# Patient Record
Sex: Female | Born: 1945 | Race: Black or African American | Hispanic: No | Marital: Married | Smoking: Never smoker
Health system: Southern US, Community
[De-identification: ages and names within clinical notes are randomized; demographics above are authoritative.]

## PROBLEM LIST (undated history)

## (undated) DIAGNOSIS — E785 Hyperlipidemia, unspecified: Secondary | ICD-10-CM

## (undated) DIAGNOSIS — K219 Gastro-esophageal reflux disease without esophagitis: Secondary | ICD-10-CM

## (undated) DIAGNOSIS — I10 Essential (primary) hypertension: Secondary | ICD-10-CM

## (undated) DIAGNOSIS — H43391 Other vitreous opacities, right eye: Secondary | ICD-10-CM

## (undated) DIAGNOSIS — D649 Anemia, unspecified: Secondary | ICD-10-CM

## (undated) DIAGNOSIS — M25571 Pain in right ankle and joints of right foot: Secondary | ICD-10-CM

## (undated) DIAGNOSIS — B9681 Helicobacter pylori [H. pylori] as the cause of diseases classified elsewhere: Secondary | ICD-10-CM

## (undated) DIAGNOSIS — E119 Type 2 diabetes mellitus without complications: Secondary | ICD-10-CM

## (undated) DIAGNOSIS — K635 Polyp of colon: Secondary | ICD-10-CM

## (undated) DIAGNOSIS — K760 Fatty (change of) liver, not elsewhere classified: Secondary | ICD-10-CM

## (undated) DIAGNOSIS — K259 Gastric ulcer, unspecified as acute or chronic, without hemorrhage or perforation: Secondary | ICD-10-CM

## (undated) DIAGNOSIS — Z8719 Personal history of other diseases of the digestive system: Secondary | ICD-10-CM

## (undated) HISTORY — PX: OTHER SURGICAL HISTORY: SHX169

## (undated) HISTORY — PX: ABDOMINAL HYSTERECTOMY: SHX81

## (undated) HISTORY — DX: Other vitreous opacities, right eye: H43.391

## (undated) HISTORY — PX: APPENDECTOMY: SHX54

## (undated) HISTORY — PX: CHOLECYSTECTOMY: SHX55

## (undated) HISTORY — PX: TONSILLECTOMY: SUR1361

## (undated) HISTORY — PX: TUBAL LIGATION: SHX77

---

## 1971-11-28 HISTORY — PX: REDUCTION MAMMAPLASTY: SUR839

## 1981-06-01 HISTORY — PX: ABDOMINAL HYSTERECTOMY: SHX81

## 2003-12-15 ENCOUNTER — Other Ambulatory Visit: Payer: Self-pay

## 2004-08-26 ENCOUNTER — Other Ambulatory Visit: Payer: Self-pay

## 2004-08-30 ENCOUNTER — Ambulatory Visit: Payer: Self-pay | Admitting: General Surgery

## 2005-01-05 ENCOUNTER — Ambulatory Visit: Payer: Self-pay | Admitting: General Surgery

## 2005-08-25 ENCOUNTER — Ambulatory Visit: Payer: Self-pay | Admitting: Family Medicine

## 2005-10-12 ENCOUNTER — Ambulatory Visit: Payer: Self-pay | Admitting: Family Medicine

## 2006-01-04 ENCOUNTER — Ambulatory Visit: Payer: Self-pay | Admitting: Gastroenterology

## 2006-01-09 ENCOUNTER — Ambulatory Visit: Payer: Self-pay | Admitting: Gastroenterology

## 2006-10-15 ENCOUNTER — Ambulatory Visit: Payer: Self-pay | Admitting: Family Medicine

## 2007-12-17 ENCOUNTER — Ambulatory Visit: Payer: Self-pay | Admitting: Family Medicine

## 2008-12-24 ENCOUNTER — Ambulatory Visit: Payer: Self-pay | Admitting: Family Medicine

## 2009-12-27 ENCOUNTER — Ambulatory Visit: Payer: Self-pay | Admitting: Family Medicine

## 2010-03-10 ENCOUNTER — Ambulatory Visit: Payer: Self-pay | Admitting: Family Medicine

## 2010-03-28 ENCOUNTER — Ambulatory Visit: Payer: Self-pay | Admitting: Family Medicine

## 2010-04-11 ENCOUNTER — Ambulatory Visit: Payer: Self-pay | Admitting: Family Medicine

## 2010-08-29 ENCOUNTER — Ambulatory Visit: Payer: Self-pay | Admitting: Family Medicine

## 2011-01-10 ENCOUNTER — Ambulatory Visit: Payer: Self-pay | Admitting: Family Medicine

## 2012-02-08 ENCOUNTER — Ambulatory Visit: Payer: Self-pay | Admitting: Family Medicine

## 2013-02-11 ENCOUNTER — Ambulatory Visit: Payer: Self-pay | Admitting: Family Medicine

## 2014-02-16 ENCOUNTER — Ambulatory Visit: Payer: Self-pay | Admitting: Family Medicine

## 2014-12-08 ENCOUNTER — Emergency Department: Payer: Self-pay | Admitting: Internal Medicine

## 2014-12-08 LAB — CBC
HCT: 41.5 % (ref 35.0–47.0)
HGB: 13.9 g/dL (ref 12.0–16.0)
MCH: 31.1 pg (ref 26.0–34.0)
MCHC: 33.6 g/dL (ref 32.0–36.0)
MCV: 93 fL (ref 80–100)
Platelet: 295 10*3/uL (ref 150–440)
RBC: 4.48 10*6/uL (ref 3.80–5.20)
RDW: 13.4 % (ref 11.5–14.5)
WBC: 10.6 10*3/uL (ref 3.6–11.0)

## 2014-12-08 LAB — COMPREHENSIVE METABOLIC PANEL
ALBUMIN: 4.2 g/dL (ref 3.4–5.0)
ALK PHOS: 113 U/L
ANION GAP: 8 (ref 7–16)
AST: 32 U/L (ref 15–37)
BUN: 15 mg/dL (ref 7–18)
Bilirubin,Total: 0.2 mg/dL (ref 0.2–1.0)
CALCIUM: 9.4 mg/dL (ref 8.5–10.1)
CREATININE: 0.73 mg/dL (ref 0.60–1.30)
Chloride: 101 mmol/L (ref 98–107)
Co2: 29 mmol/L (ref 21–32)
EGFR (African American): >60
Glucose: 165 mg/dL — ABNORMAL HIGH (ref 65–99)
OSMOLALITY: 280 (ref 275–301)
Potassium: 3.3 mmol/L — ABNORMAL LOW (ref 3.5–5.1)
SGPT (ALT): 33 U/L
Sodium: 138 mmol/L (ref 136–145)
Total Protein: 8.3 g/dL — ABNORMAL HIGH (ref 6.4–8.2)

## 2014-12-08 LAB — URINALYSIS, COMPLETE
BILIRUBIN, UR: NEGATIVE
Blood: NEGATIVE
Glucose,UR: 50 mg/dL (ref 0–75)
Ketone: NEGATIVE
Leukocyte Esterase: NEGATIVE
Nitrite: NEGATIVE
PH: 6 (ref 4.5–8.0)
Specific Gravity: 1.011 (ref 1.003–1.030)
Squamous Epithelial: 5

## 2014-12-08 LAB — TROPONIN I: Troponin-I: <0.02 ng/mL

## 2014-12-08 LAB — CK TOTAL AND CKMB (NOT AT ARMC)
CK, Total: 128 U/L (ref 26–192)
CK-MB: 1.2 ng/mL (ref 0.5–3.6)

## 2014-12-08 LAB — TSH: Thyroid Stimulating Horm: 1.84 u[IU]/mL

## 2015-02-19 ENCOUNTER — Ambulatory Visit: Payer: Self-pay | Admitting: Family Medicine

## 2015-05-24 ENCOUNTER — Encounter: Payer: Self-pay | Admitting: *Deleted

## 2015-05-25 ENCOUNTER — Encounter
Admission: RE | Disposition: A | Discharge status: Home / Self Care | Payer: Self-pay | Source: Ambulatory Visit | Attending: Gastroenterology

## 2015-05-25 ENCOUNTER — Ambulatory Visit
Admission: RE | Admit: 2015-05-25 | Discharge: 2015-05-25 | Disposition: A | Discharge status: Home / Self Care | Payer: Medicare PPO | Source: Ambulatory Visit | Attending: Gastroenterology | Admitting: Gastroenterology

## 2015-05-25 ENCOUNTER — Ambulatory Visit: Payer: Medicare PPO | Admitting: Anesthesiology

## 2015-05-25 ENCOUNTER — Encounter: Payer: Self-pay | Admitting: *Deleted

## 2015-05-25 DIAGNOSIS — Z7982 Long term (current) use of aspirin: Secondary | ICD-10-CM | POA: Diagnosis not present

## 2015-05-25 DIAGNOSIS — Z79899 Other long term (current) drug therapy: Secondary | ICD-10-CM | POA: Insufficient documentation

## 2015-05-25 DIAGNOSIS — Z881 Allergy status to other antibiotic agents status: Secondary | ICD-10-CM | POA: Insufficient documentation

## 2015-05-25 DIAGNOSIS — E119 Type 2 diabetes mellitus without complications: Secondary | ICD-10-CM | POA: Diagnosis not present

## 2015-05-25 DIAGNOSIS — I10 Essential (primary) hypertension: Secondary | ICD-10-CM | POA: Diagnosis not present

## 2015-05-25 DIAGNOSIS — D125 Benign neoplasm of sigmoid colon: Secondary | ICD-10-CM | POA: Insufficient documentation

## 2015-05-25 DIAGNOSIS — Z1211 Encounter for screening for malignant neoplasm of colon: Secondary | ICD-10-CM | POA: Diagnosis present

## 2015-05-25 HISTORY — DX: Fatty (change of) liver, not elsewhere classified: K76.0

## 2015-05-25 HISTORY — DX: Essential (primary) hypertension: I10

## 2015-05-25 HISTORY — DX: Gastric ulcer, unspecified as acute or chronic, without hemorrhage or perforation: K25.9

## 2015-05-25 HISTORY — DX: Anemia, unspecified: D64.9

## 2015-05-25 HISTORY — DX: Type 2 diabetes mellitus without complications: E11.9

## 2015-05-25 HISTORY — DX: Helicobacter pylori (H. pylori) as the cause of diseases classified elsewhere: B96.81

## 2015-05-25 HISTORY — PX: COLONOSCOPY: SHX5424

## 2015-05-25 LAB — GLUCOSE, CAPILLARY: Glucose-Capillary: 105 mg/dL — ABNORMAL HIGH (ref 65–99)

## 2015-05-25 SURGERY — COLONOSCOPY
Anesthesia: General

## 2015-05-25 MED ORDER — SODIUM CHLORIDE 0.9 % IV SOLN
INTRAVENOUS | Status: DC
  Administered 2015-05-25: 1000 mL via INTRAVENOUS

## 2015-05-25 MED ORDER — PROPOFOL INFUSION 10 MG/ML OPTIME
INTRAVENOUS | Status: DC | PRN
  Administered 2015-05-25: 140 ug/kg/min via INTRAVENOUS

## 2015-05-25 MED ORDER — FENTANYL CITRATE (PF) 100 MCG/2ML IJ SOLN
25.0000 ug | INTRAMUSCULAR | Status: DC | PRN
  Administered 2015-05-25: 50 ug via INTRAVENOUS

## 2015-05-25 MED ORDER — PHENYLEPHRINE HCL 10 MG/ML IJ SOLN
INTRAMUSCULAR | Status: DC | PRN
  Administered 2015-05-25 (×2): 100 ug via INTRAVENOUS

## 2015-05-25 MED ORDER — ONDANSETRON HCL 4 MG/2ML IJ SOLN: 4.0000 mg | Freq: Once | INTRAMUSCULAR | Status: AC | PRN

## 2015-05-25 MED ORDER — PROPOFOL 10 MG/ML IV BOLUS
INTRAVENOUS | Status: DC | PRN
  Administered 2015-05-25: 50 mg via INTRAVENOUS

## 2015-05-25 MED ORDER — SODIUM CHLORIDE 0.9 % IV SOLN
INTRAVENOUS | Status: DC
  Administered 2015-05-25: 11:00:00 via INTRAVENOUS

## 2015-05-25 NOTE — Transfer of Care (Signed)
Immediate Anesthesia Transfer of Care Note  Patient: Jasmine Welch  Procedure(s) Performed: Procedure(s): COLONOSCOPY (N/A)  Patient Location: PACU  Anesthesia Type:General  Level of Consciousness: sedated  Airway & Oxygen Therapy: Patient Spontanous Breathing and Patient connected to nasal cannula oxygen  Post-op Assessment: Report given to RN and Post -op Vital signs reviewed and stable  Post vital signs: Reviewed and stable  Last Vitals:  Filed Vitals:   05/25/15 0910  BP: 140/85  Pulse: 88  Temp: 36 C  Resp: 20    Complications: No apparent anesthesia complications

## 2015-05-25 NOTE — Anesthesia Preprocedure Evaluation (Signed)
Anesthesia Evaluation  Patient identified by MRN, date of birth, ID band Patient awake    Reviewed: Allergy & Precautions, NPO status , Patient's Chart, lab work & pertinent test results  Airway Mallampati: II       Dental no notable dental hx.    Pulmonary  Sinus allergies breath sounds clear to auscultation  Pulmonary exam normal       Cardiovascular hypertension, Normal cardiovascular exam+ Valvular Problems/Murmurs     Neuro/Psych negative neurological ROS  negative psych ROS   GI/Hepatic Neg liver ROS, PUD,   Endo/Other  diabetes, Well Controlled, Type 2, Oral Hypoglycemic Agents  Renal/GU negative Renal ROS  negative genitourinary   Musculoskeletal negative musculoskeletal ROS (+)   Abdominal Normal abdominal exam  (+)   Peds negative pediatric ROS (+)  Hematology  (+) anemia ,   Anesthesia Other Findings   Reproductive/Obstetrics                             Anesthesia Physical Anesthesia Plan  ASA: II  Anesthesia Plan: General   Post-op Pain Management:    Induction: Intravenous  Airway Management Planned: Nasal Cannula  Additional Equipment:   Intra-op Plan:   Post-operative Plan:   Informed Consent: I have reviewed the patients History and Physical, chart, labs and discussed the procedure including the risks, benefits and alternatives for the proposed anesthesia with the patient or authorized representative who has indicated his/her understanding and acceptance.   Dental advisory given  Plan Discussed with: CRNA and Surgeon  Anesthesia Plan Comments:         Anesthesia Quick Evaluation

## 2015-05-25 NOTE — Anesthesia Postprocedure Evaluation (Signed)
  Anesthesia Post-op Note  Patient: Jasmine Welch  Procedure(s) Performed: Procedure(s): COLONOSCOPY (N/A)  Anesthesia type:General  Patient location: PACU  Post pain: Pain level controlled  Post assessment: Post-op Vital signs reviewed, Patient's Cardiovascular Status Stable, Respiratory Function Stable, Patent Airway and No signs of Nausea or vomiting  Post vital signs: Reviewed and stable  Last Vitals:  Filed Vitals:   05/25/15 1150  BP: 113/75  Pulse: 73  Temp:   Resp: 15    Level of consciousness: awake, alert  and patient cooperative  Complications: No apparent anesthesia complications

## 2015-05-25 NOTE — H&P (Signed)
Outpatient short stay form Pre-procedure 05/25/2015 10:23 AM Lollie Sails MD  Primary Physician: Dr. Juluis Pitch  Reason for visit:  Screening colonoscopy  History of present illness:  She is a 69 year old female setting as above. Patient had a colonoscopy done in 2006. There was no polyps at that time. He has no family history of colon cancer or colon polyps. Does not take any anticoagulates medications and stopped her 81 mg aspirin about a week ago.    Current facility-administered medications:  .  0.9 %  sodium chloride infusion, , Intravenous, Continuous, Lollie Sails, MD .  0.9 %  sodium chloride infusion, , Intravenous, Continuous, Lollie Sails, MD, Last Rate: 50 mL/hr at 05/25/15 0939, 1,000 mL at 05/25/15 0939 .  fentaNYL (SUBLIMAZE) injection 25 mcg, 25 mcg, Intravenous, Q5 min PRN, Alvin Critchley, MD .  ondansetron [facility]) injection 4 mg, 4 mg, Intravenous, Once PRN, Alvin Critchley, MD  Prescriptions prior to admission  Medication Sig Dispense Refill Last Dose  . amLODipine (NORVASC) 10 MG tablet Take 10 mg by mouth daily.     Marland Kitchen aspirin (ASPIRIN EC) 81 MG EC tablet Take 81 mg by mouth daily. Swallow whole.     . cetirizine (ZYRTEC) 10 MG tablet Take 10 mg by mouth daily.     Marland Kitchen dicyclomine (BENTYL) 10 MG capsule Take 10 mg by mouth daily.     Marland Kitchen estrogen-methylTESTOSTERone 0.625-1.25 MG per tablet Take 1 tablet by mouth daily.     . fluticasone (FLONASE) 50 MCG/ACT nasal spray Place 2 sprays into both nostrils daily.     . metFORMIN (GLUCOPHAGE) 500 MG tablet Take by mouth 2 (two) times daily with a meal.     . omeprazole (PRILOSEC) 20 MG capsule Take 20 mg by mouth daily.     . simvastatin (ZOCOR) 10 MG tablet Take 10 mg by mouth daily.     . trandolapril (MAVIK) 4 MG tablet Take 4 mg by mouth daily.     Marland Kitchen triamterene-hydrochlorothiazide (MAXZIDE-25) 37.5-25 MG per tablet Take 1 tablet by mouth daily.        Allergies  Allergen Reactions  . Augmentin  [Amoxicillin-Pot Clavulanate] Other (See Comments)  . Benzodiazepines Other (See Comments)     Past Medical History  Diagnosis Date  . Hypertension   . Anemia   . Diabetes mellitus without complication   . Gastric ulcer due to Helicobacter pylori   . Fatty liver   . Heart murmur     Review of systems:      Physical Exam    Heart and lungs: Regular rate and rhythm without rub or gallop lungs are bilaterally clear    HEENT: Normocephalic atraumatic eyes are anicteric    Other:     Pertinant exam for procedure: Soft nontender nondistended bowel sounds positive normoactive    Planned proceedures: Colonoscopy and indicated procedures    Lollie Sails, MD Gastroenterology 05/25/2015  10:23 AM

## 2015-05-25 NOTE — Op Note (Signed)
[facility] Gastroenterology Patient Name: Jasmine Welch Procedure Date: 05/25/2015 10:25 AM MRN: [id number] Account #: [id number] Date of Birth: 1946/05/12 Admit Type: Outpatient Age: 69 Room: [facility] ENDO ROOM 3 Gender: Female Note Status: Finalized Procedure:         Colonoscopy Indications:       Screening for colorectal malignant neoplasm Providers:         Lollie Sails, MD Referring MD:      Youlanda Roys. Lovie Macadamia, MD (Referring MD) Medicines:         Monitored Anesthesia Care Complications:     No immediate complications. Procedure:         Pre-Anesthesia Assessment:                    - ASA Grade Assessment: II - A patient with mild systemic                     disease.                    After obtaining informed consent, the colonoscope was                     passed under direct vision. Throughout the procedure, the                     patient's blood pressure, pulse, and oxygen saturations                     were monitored continuously. The Olympus PCF-H180AL                     colonoscope ( S#: [id number] ) was introduced through the                     anus and advanced to the the cecum, identified by                     appendiceal orifice and ileocecal valve. The colonoscopy                     was performed without difficulty. The patient tolerated                     the procedure well. The quality of the bowel preparation                     was good. Findings:      Three sessile polyps were found in the mid sigmoid colon. The polyps       were 1 to 2 mm in size. These polyps were removed with a cold biopsy       forceps. Resection and retrieval were complete.      The exam was otherwise normal throughout the examined colon.      The digital rectal exam was normal.      The retroflexed view of the distal rectum and anal verge was normal and       showed no anal or rectal abnormalities. Impression:        - Three 1 to 2 mm polyps in the mid  sigmoid colon.                     Resected and retrieved.                    -  The distal rectum and anal verge are normal on                     retroflexion view. Recommendation:    - Discharge patient to home.                    - Telephone GI clinic for pathology results in 1 week. Procedure Code(s): --- Professional ---                    [phone], Colonoscopy, flexible; with biopsy, single or                     multiple Diagnosis Code(s): --- Professional ---                    V76.51, Special screening for malignant neoplasms of colon                    211.3, Benign neoplasm of colon CPT copyright 2014 American Medical Association. All rights reserved. The codes documented in this report are preliminary and upon coder review may  be revised to meet current compliance requirements. Lollie Sails, MD 05/25/2015 11:07:11 AM This report has been signed electronically. Number of Addenda: 0 Note Initiated On: 05/25/2015 10:25 AM Scope Withdrawal Time: 0 hours 8 minutes 32 seconds  Total Procedure Duration: 0 hours 18 minutes 56 seconds       [facility]

## 2015-05-26 ENCOUNTER — Encounter: Payer: Self-pay | Admitting: Gastroenterology

## 2015-05-26 LAB — SURGICAL PATHOLOGY

## 2015-10-08 ENCOUNTER — Ambulatory Visit: Payer: Medicare PPO | Attending: Orthopedic Surgery | Admitting: Physical Therapy

## 2015-10-08 DIAGNOSIS — M5441 Lumbago with sciatica, right side: Secondary | ICD-10-CM | POA: Insufficient documentation
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
## 2015-10-11 ENCOUNTER — Ambulatory Visit: Payer: Medicare PPO | Admitting: Physical Therapy

## 2015-10-11 DIAGNOSIS — M5441 Lumbago with sciatica, right side: Secondary | ICD-10-CM | POA: Diagnosis not present

## 2015-10-11 NOTE — Therapy (Signed)
[place] [facility] [address], [place], [address] Phone: [phone]   Fax:  [phone]  Physical Therapy Treatment  Patient Details  Name: Jasmine Welch MRN: [id number] Date of Birth: [birth year] Referring Provider: Rachelle Hora  Encounter Date: 10/11/2015      PT End of Session - 10/11/15 0909    Visit Number 2   Number of Visits 13   Date for PT Re-Evaluation 11/19/15   Authorization Type 2   Authorization Time Period 10   PT Start Time 0830   PT Stop Time 0915   PT Time Calculation (min) 45 min   Activity Tolerance Patient tolerated treatment well;No increased pain   Behavior During Therapy [facility] for tasks assessed/performed      Past Medical History  Diagnosis Date  . Hypertension   . Anemia   . Diabetes mellitus without complication   . Gastric ulcer due to Helicobacter pylori   . Fatty liver   . Heart murmur     Past Surgical History  Procedure Laterality Date  . Mamoplastic reduction    . Abdominal hysterectomy    . Tubal ligation    . Tonsillectomy    . Carpel tunnel    . Cholecystectomy    . Colonoscopy N/A 05/25/2015    Procedure: COLONOSCOPY;  Surgeon: Lollie Sails, MD;  Location: [facility] ENDOSCOPY;  Service: Endoscopy;  Laterality: N/A;    There were no vitals filed for this visit.  Visit Diagnosis:  Right-sided low back pain with right-sided sciatica      Subjective Assessment - 10/11/15 0908    Subjective Patient reports her pain is a little better. She performed exercises over the weekend.    Limitations Walking;Standing;Sitting;Lifting;House hold activities   Patient Stated Goals to have decreased pain   Currently in Pain? Yes   Pain Score 3    Pain Location Back   Pain Orientation Right;Posterior;Lower   Pain Descriptors / Indicators Aching;Tender   Pain Type Chronic pain   Pain Onset 1 to 4 weeks ago   Pain Frequency Constant   Multiple Pain Sites No         Treatment:    Supine bridging x 10, lumbar rotations 2x 1 min, SKTC x 5 x 2 reps Manual therapy for muscle trigger point release. Right lumbar paraspinals.  Moist heat at end of session for comfort and pain relief. Educated patient to use tennis ball for self trigger point release/mobilizations.  Patient felt relief with these activities.           PT Education - 10/11/15 0909    Education provided Yes   Education Details exercises, home self mobilization. heat   Person(s) Educated Patient   Methods Explanation;Demonstration;Verbal cues   Comprehension Verbalized understanding;Returned demonstration             PT Long Term Goals - 10/08/15 1124    PT LONG TERM GOAL #1   Title Patient will decrease reported pain to < 2/10 with aggrivating activities.    Baseline 4/10   Time 6   Period Weeks   Status New   PT LONG TERM GOAL #2   Title Patient will demonstrate decreased pain with functional activities by a decreased modified Oswestry score of < 20%   Baseline 28%   Time 6   Period Weeks   Status New               Plan - 10/11/15 [id number]  Clinical Impression Statement Patient has had good response to HEP, requires continued manual techniques for muscle relaxation and decreased pain.    Pt will benefit from skilled therapeutic intervention in order to improve on the following deficits Pain;Difficulty walking;Decreased activity tolerance   Rehab Potential Good   PT Frequency 2x / week   PT Duration 6 weeks   PT Treatment/Interventions Therapeutic activities;Therapeutic exercise;Dry needling;Patient/family education;Neuromuscular re-education;Moist Heat;Manual techniques   PT Next Visit Plan Manual therapy for pain control and for tissue relaxation.    PT Home Exercise Plan lumbar rotation, bridging, SKTC 2 x per day   Consulted and Agree with Plan of Care Patient        Problem List There are no active problems to display for this  patient.   Janeah Kovacich, PT, MPT, GCS 10/11/2015, 9:12 AM  [place] [facility] [address], [place], [address] Phone: [phone]   Fax:  [phone]  Name: Jasmine Welch MRN: [id number] Date of Birth: [birth year]

## 2015-10-14 ENCOUNTER — Encounter: Payer: Medicare PPO | Admitting: Physical Therapy

## 2015-10-15 ENCOUNTER — Ambulatory Visit: Payer: Medicare PPO | Admitting: Physical Therapy

## 2015-10-15 DIAGNOSIS — M5441 Lumbago with sciatica, right side: Secondary | ICD-10-CM | POA: Diagnosis not present

## 2015-10-15 NOTE — Therapy (Signed)
[place] [facility] [address], [place], [address] Phone: [phone]   Fax:  [phone]  Physical Therapy Treatment  Patient Details  Name: Jasmine Welch MRN: [id number] Date of Birth: Jun 05, 1946 Referring Provider: Rachelle Hora  Encounter Date: 10/15/2015      PT End of Session - 10/15/15 0905    Visit Number 3   Number of Visits 13   Date for PT Re-Evaluation 11/19/15   Authorization Type 3   Authorization Time Period 10   PT Start Time 0830   PT Stop Time 0910   PT Time Calculation (min) 40 min   Activity Tolerance Patient tolerated treatment well;Patient limited by pain   Behavior During Therapy [facility] for tasks assessed/performed      Past Medical History  Diagnosis Date  . Hypertension   . Anemia   . Diabetes mellitus without complication   . Gastric ulcer due to Helicobacter pylori   . Fatty liver   . Heart murmur     Past Surgical History  Procedure Laterality Date  . Mamoplastic reduction    . Abdominal hysterectomy    . Tubal ligation    . Tonsillectomy    . Carpel tunnel    . Cholecystectomy    . Colonoscopy N/A 05/25/2015    Procedure: COLONOSCOPY;  Surgeon: Lollie Sails, MD;  Location: [facility] ENDOSCOPY;  Service: Endoscopy;  Laterality: N/A;    There were no vitals filed for this visit.  Visit Diagnosis:  Right-sided low back pain with right-sided sciatica      Subjective Assessment - 10/15/15 0832    Subjective Patient reports pain is better. Has been using heat at home. Feeling very little pain when not moving. But the pain then just grabs her at times.    Limitations Walking;Standing;Sitting;Lifting;House hold activities   Patient Stated Goals to have decreased pain   Currently in Pain? No/denies        Treatment to include,  Moist heat for 5 min for tissue warming and relaxation  Manual therapy to right side lumbar spine for trigger point relaxation and pain  control.  Active assisted Stretching of lumbar spine, piriformis, QL Exercises to include: pelvic tilts, bridging, lumbar rotation, and SKTC x 10 reps each          PT Education - 10/15/15 0905    Education provided Yes   Education Details exercises, breathing, proper form, stretching   Person(s) Educated Patient   Methods Explanation;Demonstration   Comprehension Verbalized understanding;Returned demonstration;Verbal cues required             PT Long Term Goals - 10/08/15 1124    PT LONG TERM GOAL #1   Title Patient will decrease reported pain to < 2/10 with aggrivating activities.    Baseline 4/10   Time 6   Period Weeks   Status New   PT LONG TERM GOAL #2   Title Patient will demonstrate decreased pain with functional activities by a decreased modified Oswestry score of < 20%   Baseline 28%   Time 6   Period Weeks   Status New               Plan - 10/15/15 0920    Clinical Impression Statement Patient is improving with therapy and HEP. Patient continues to have point tenderness at lumbar spine on right side.    Pt will benefit from skilled therapeutic intervention in order to improve on the following deficits  Pain;Difficulty walking;Decreased activity tolerance   Rehab Potential Good   PT Frequency 2x / week   PT Duration 6 weeks   PT Treatment/Interventions Therapeutic activities;Therapeutic exercise;Dry needling;Patient/family education;Neuromuscular re-education;Moist Heat;Manual techniques   PT Next Visit Plan Manual therapy for pain control and for tissue relaxation.    PT Home Exercise Plan lumbar rotation, bridging, SKTC 2 x per day, added pelvic tilts   Consulted and Agree with Plan of Care Patient        Problem List There are no active problems to display for this patient.   Brandee Markin, PT, MPT, GCS 10/15/2015, 9:22 AM  [place] [facility] [address] [place],  [address] Phone: [phone]   Fax:  [phone]  Name: Jasmine Welch MRN: [id number] Date of Birth: 04/26/1946

## 2015-10-18 ENCOUNTER — Ambulatory Visit: Payer: Medicare PPO | Admitting: Physical Therapy

## 2015-10-18 DIAGNOSIS — M5441 Lumbago with sciatica, right side: Secondary | ICD-10-CM

## 2015-10-18 NOTE — Therapy (Signed)
[facility] [address], [place], [address] Phone: [phone]   Fax:  [phone]  Physical Therapy Treatment  Patient Details  Name: Jasmine Welch MRN: [id number] Date of Birth: [birth year] Referring Provider: Rachelle Hora  Encounter Date: 10/18/2015      PT End of Session - 10/18/15 1553    Visit Number 4   Number of Visits 13   Date for PT Re-Evaluation 11/19/15   Authorization Type 4   Authorization Time Period 10   PT Start Time 1500   PT Stop Time 1545   PT Time Calculation (min) 45 min   Activity Tolerance Patient tolerated treatment well;No increased pain   Behavior During Therapy [facility] for tasks assessed/performed      Past Medical History  Diagnosis Date  . Hypertension   . Anemia   . Diabetes mellitus without complication   . Gastric ulcer due to Helicobacter pylori   . Fatty liver   . Heart murmur     Past Surgical History  Procedure Laterality Date  . Mamoplastic reduction    . Abdominal hysterectomy    . Tubal ligation    . Tonsillectomy    . Carpel tunnel    . Cholecystectomy    . Colonoscopy N/A 05/25/2015    Procedure: COLONOSCOPY;  Surgeon: Lollie Sails, MD;  Location: [facility] ENDOSCOPY;  Service: Endoscopy;  Laterality: N/A;    There were no vitals filed for this visit.  Visit Diagnosis:  Right-sided low back pain with right-sided sciatica      Subjective Assessment - 10/18/15 1551    Subjective Patient reports her back pain seems a little better. States she was sore after her last session.    Limitations Walking;Standing;Sitting;Lifting;House hold activities   Patient Stated Goals to have decreased pain   Currently in Pain? No/denies   Multiple Pain Sites No          Treatment to include,  Moist heat for 5 min for tissue warming and relaxation  Manual therapy to right side lumbar spine for trigger point relaxation and pain control.  Active  assisted Stretching of lumbar spine, piriformis, QL Exercises to include: pelvic tilts, bridging, lumbar rotation, and SKTC x 10 reps each bilaterally          PT Education - 10/18/15 1552    Education provided Yes   Education Details exercises to perform at home, breathing through exercises and stretches.    Person(s) Educated Patient   Methods Explanation;Demonstration   Comprehension Verbalized understanding;Returned demonstration             PT Long Term Goals - 10/08/15 1124    PT LONG TERM GOAL #1   Title Patient will decrease reported pain to < 2/10 with aggrivating activities.    Baseline 4/10   Time 6   Period Weeks   Status New   PT LONG TERM GOAL #2   Title Patient will demonstrate decreased pain with functional activities by a decreased modified Oswestry score of < 20%   Baseline 28%   Time 6   Period Weeks   Status New               Plan - 10/18/15 1553    Clinical Impression Statement Patient reports decreased pain with manual techniques and exercises. Will continue along this path of treatment.   Pt will benefit from skilled therapeutic intervention in order to improve on the following deficits Pain;Difficulty walking;Decreased  activity tolerance   Rehab Potential Good   PT Frequency 2x / week   PT Duration 6 weeks   PT Treatment/Interventions Therapeutic activities;Therapeutic exercise;Dry needling;Patient/family education;Neuromuscular re-education;Moist Heat;Manual techniques   PT Next Visit Plan Manual therapy for pain control and for tissue relaxation.    PT Home Exercise Plan lumbar rotation, bridging, SKTC 2 x per day, added pelvic tilts   Consulted and Agree with Plan of Care Patient        Problem List There are no active problems to display for this patient.   Oliviana Mcgahee, PT, MPT, GCS 10/18/2015, 3:58 PM  [place] [facility] [address] [place],  [address] Phone: [phone]   Fax:  [phone]  Name: Jasmine Welch MRN: [id number] Date of Birth: [birth year]

## 2015-10-25 ENCOUNTER — Ambulatory Visit: Payer: Medicare PPO | Admitting: Physical Therapy

## 2015-10-25 DIAGNOSIS — M5441 Lumbago with sciatica, right side: Secondary | ICD-10-CM

## 2015-10-25 NOTE — Therapy (Signed)
[facility] [address] [place], [address] Phone: [phone]   Fax:  [phone]  Physical Therapy Treatment  Patient Details  Name: Jasmine Welch MRN: [id number] Date of Birth: 19-Sep-1946 Referring Provider: Rachelle Hora  Encounter Date: 10/25/2015      PT End of Session - 10/25/15 1519    Visit Number (p) 5   Number of Visits (p) 13   Date for PT Re-Evaluation (p) 11/19/15   Authorization Type (p) 5   Authorization Time Period (p) 10   PT Start Time (p) 0315   PT Stop Time (p) 0400   PT Time Calculation (min) (p) 45 min      Past Medical History  Diagnosis Date  . Hypertension   . Anemia   . Diabetes mellitus without complication   . Gastric ulcer due to Helicobacter pylori   . Fatty liver   . Heart murmur     Past Surgical History  Procedure Laterality Date  . Mamoplastic reduction    . Abdominal hysterectomy    . Tubal ligation    . Tonsillectomy    . Carpel tunnel    . Cholecystectomy    . Colonoscopy N/A 05/25/2015    Procedure: COLONOSCOPY;  Surgeon: Lollie Sails, MD;  Location[facility]COPY;  Service: Endoscopy;  Laterality: N/A;    There were no vitals filed for this visit.  Visit Diagnosis:  Right-sided low back pain with right-sided sciatica      Subjective Assessment - 10/25/15 1517    Subjective Patient reports no pain currently. Feels like her back has improved.    Limitations Walking;Standing;Sitting;Lifting;House hold activities   Patient Stated Goals to have decreased pain   Currently in Pain? No/denies          Treatment to include,  Moist heat for 5 min for tissue warming and relaxation  Manual therapy to right side lumbar spine for trigger point relaxation and pain control.  Exercises to include: pelvic tilts, bridging, lumbar rotation, and SKTC x 10 reps each bilaterally Active standing stretching of right lateral side, extension at  wall, lateral glides at wall. Seated forward and lateral flexion.         PT Education - 10/25/15 1518    Education provided Yes             PT Long Term Goals - 10/08/15 1124    PT LONG TERM GOAL #1   Title Patient will decrease reported pain to < 2/10 with aggrivating activities.    Baseline 4/10   Time 6   Period Weeks   Status New   PT LONG TERM GOAL #2   Title Patient will demonstrate decreased pain with functional activities by a decreased modified Oswestry score of < 20%   Baseline 28%   Time 6   Period Weeks   Status New               Plan - 10/25/15 1604    Clinical Impression Statement Patient reports improved symptoms, however continues to have point tenderness.    Pt will benefit from skilled therapeutic intervention in order to improve on the following deficits Pain;Difficulty walking;Decreased activity tolerance   Rehab Potential Good   PT Frequency 2x / week   PT Duration 6 weeks   PT Treatment/Interventions Therapeutic activities;Therapeutic exercise;Dry needling;Patient/family education;Neuromuscular re-education;Moist Heat;Manual techniques   PT Next Visit Plan Manual therapy for pain control and for tissue relaxation.  PT Home Exercise Plan lumbar rotation, bridging, SKTC 2 x per day, added pelvic tilts   Consulted and Agree with Plan of Care Patient        Problem List There are no active problems to display for this patient.   Mohan Erven, PT, MPT, GCS 10/25/2015, 4:11 PM  [place] [facility] [address] [place], [address] Phone: [phone]   Fax:  [phone]  Name: Jasmine Welch MRN: [id number] Date of Birth: [birth year]

## 2015-10-28 ENCOUNTER — Ambulatory Visit: Payer: Medicare PPO | Attending: Orthopedic Surgery | Admitting: Physical Therapy

## 2015-10-28 DIAGNOSIS — M5441 Lumbago with sciatica, right side: Secondary | ICD-10-CM | POA: Insufficient documentation

## 2015-11-01 ENCOUNTER — Ambulatory Visit: Payer: Medicare PPO | Admitting: Physical Therapy

## 2015-11-01 DIAGNOSIS — M5441 Lumbago with sciatica, right side: Secondary | ICD-10-CM | POA: Diagnosis not present

## 2015-11-01 NOTE — Therapy (Signed)
[facility] [address], [place], [address] Phone: [phone]   Fax:  [phone]  Physical Therapy Treatment  Patient Details  Name: Jasmine Welch MRN: [id number] Date of Birth: 1946-01-24 Referring Provider: Rachelle Hora  Encounter Date: 11/01/2015      PT End of Session - 11/01/15 1602    Visit Number 5   Number of Visits 13   Date for PT Re-Evaluation 11/19/15   Authorization Type 5   Authorization Time Period 10   PT Start Time [id number]   PT Stop Time 1605   PT Time Calculation (min) 50 min   Activity Tolerance Patient tolerated treatment well;No increased pain   Behavior During Therapy [facility] for tasks assessed/performed      Past Medical History  Diagnosis Date  . Hypertension   . Anemia   . Diabetes mellitus without complication   . Gastric ulcer due to Helicobacter pylori   . Fatty liver   . Heart murmur     Past Surgical History  Procedure Laterality Date  . Mamoplastic reduction    . Abdominal hysterectomy    . Tubal ligation    . Tonsillectomy    . Carpel tunnel    . Cholecystectomy    . Colonoscopy N/A 05/25/2015    Procedure: COLONOSCOPY;  Surgeon: Lollie Sails, MD;  Location: [facility] ENDOSCOPY;  Service: Endoscopy;  Laterality: N/A;    There were no vitals filed for this visit.  Visit Diagnosis:  Right-sided low back pain with right-sided sciatica      Subjective Assessment - 11/01/15 1600    Subjective Patient reports she missed appointment last week, thought it was on Friday afternoon. Pt reports some increased back pain since last session.    Limitations Walking;Standing;Sitting;Lifting;House hold activities   Patient Stated Goals to have decreased pain   Currently in Pain? Yes   Pain Score 4    Pain Location Back   Pain Orientation Right;Posterior;Lower   Pain Descriptors / Indicators Aching;Tightness   Pain Type Chronic pain   Pain Onset More than a month  ago   Pain Frequency Intermittent   Multiple Pain Sites No         Treatment to include,  Moist heat for 5 min for tissue warming and relaxation  Manual therapy to right side lumbar spine for trigger point relaxation and pain control.  Exercises to include: pelvic tilts, bridging, lumbar rotation, and SKTC x 10 reps each bilaterally Cues given for proper technique. Active standing extension at wall, lateral glides at wall. Seated forward and lateral flexion x 10-15 reps. Cues given for proper form.            PT Education - 11/01/15 1602    Education provided Yes   Education Details breathing through stretching, HEP for back stretching   Person(s) Educated Patient   Methods Explanation;Demonstration;Verbal cues   Comprehension Verbalized understanding;Returned demonstration             PT Long Term Goals - 10/08/15 1124    PT LONG TERM GOAL #1   Title Patient will decrease reported pain to < 2/10 with aggrivating activities.    Baseline 4/10   Time 6   Period Weeks   Status New   PT LONG TERM GOAL #2   Title Patient will demonstrate decreased pain with functional activities by a decreased modified Oswestry score of < 20%   Baseline 28%   Time 6  Period Weeks   Status New               Plan - 11/01/15 1605    Clinical Impression Statement Patient continues to have right sided back pain with certain movements/positions.    Pt will benefit from skilled therapeutic intervention in order to improve on the following deficits Pain;Difficulty walking;Decreased activity tolerance   Rehab Potential Good   PT Frequency 2x / week   PT Duration 6 weeks   PT Treatment/Interventions Therapeutic activities;Therapeutic exercise;Dry needling;Patient/family education;Neuromuscular re-education;Moist Heat;Manual techniques   PT Next Visit Plan Manual therapy for pain control and for tissue relaxation.    PT Home Exercise Plan lumbar rotation, bridging, SKTC 2 x  per day, added pelvic tilts   Consulted and Agree with Plan of Care Patient        Problem List There are no active problems to display for this patient.   Iverson Sees, PT, MPT, GCS 11/01/2015, 4:06 PM  [place] [facility] [address] [place], [address] Phone: [phone]   Fax:  [phone]  Name: Jasmine Welch MRN: [id number] Date of Birth: April 28, 1946

## 2015-11-04 ENCOUNTER — Ambulatory Visit: Payer: Medicare PPO | Admitting: Physical Therapy

## 2015-11-04 DIAGNOSIS — M5441 Lumbago with sciatica, right side: Secondary | ICD-10-CM

## 2015-11-04 NOTE — Therapy (Signed)
[place] [address], [place], [address] Phone: [phone]   Fax:  [phone]  Physical Therapy Treatment  Patient Details  Name: Jasmine Welch MRN: [id number] Date of Birth: Oct 05, 1946 Referring Provider: Rachelle Hora  Encounter Date: 11/04/2015      PT End of Session - 11/04/15 1555    Visit Number 7   Number of Visits 13   Date for PT Re-Evaluation 11/19/15   Authorization Type 6   Authorization Time Period 10   PT Start Time 1515   PT Stop Time 1600   PT Time Calculation (min) 45 min   Activity Tolerance Patient tolerated treatment well;No increased pain   Behavior During Therapy [facility] for tasks assessed/performed      Past Medical History  Diagnosis Date  . Hypertension   . Anemia   . Diabetes mellitus without complication   . Gastric ulcer due to Helicobacter pylori   . Fatty liver   . Heart murmur     Past Surgical History  Procedure Laterality Date  . Mamoplastic reduction    . Abdominal hysterectomy    . Tubal ligation    . Tonsillectomy    . Carpel tunnel    . Cholecystectomy    . Colonoscopy N/A 05/25/2015    Procedure: COLONOSCOPY;  Surgeon: Lollie Sails, MD;  Location: [facility] ENDOSCOPY;  Service: Endoscopy;  Laterality: N/A;    There were no vitals filed for this visit.  Visit Diagnosis:  Right-sided low back pain with right-sided sciatica      Subjective Assessment - 11/04/15 1552    Subjective Patient reports some improvement in pain, however reports difficulty reaching up.     Limitations Walking;Standing;Sitting;Lifting;House hold activities   Patient Stated Goals to have decreased pain   Currently in Pain? Yes   Pain Score 2    Pain Location Back   Pain Orientation Right;Posterior;Lower   Pain Descriptors / Indicators Aching;Sore;Tightness   Pain Type Chronic pain   Pain Onset More than a month ago   Pain Frequency Intermittent   Multiple Pain  Sites No        Treatment to include,   Manual therapy to right side lumbar spine for trigger point relaxation and pain control.  Exercises to include: pelvic tilts, bridging, lumbar rotation, and SKTC x 20 reps each bilaterally Cues given for proper technique. Active standing extension at wall, at wall. Seated forward and lateral flexion x 10-15 reps. Cues given for proper form.  extension at wall agrivates pain as does pelvic tilt.        PT Education - 11/04/15 1554    Education provided Yes   Education Details stretches, holding stretch at painful point   Person(s) Educated Patient   Methods Explanation   Comprehension Verbalized understanding;Returned demonstration             PT Long Term Goals - 10/08/15 1124    PT LONG TERM GOAL #1   Title Patient will decrease reported pain to < 2/10 with aggrivating activities.    Baseline 4/10   Time 6   Period Weeks   Status New   PT LONG TERM GOAL #2   Title Patient will demonstrate decreased pain with functional activities by a decreased modified Oswestry score of < 20%   Baseline 28%   Time 6   Period Weeks   Status New  Plan - 11/04/15 1556    Clinical Impression Statement Patient continues to have point tnederness at lumbar spine on right with palpation and stretching, however pain level has decreased overall.    Pt will benefit from skilled therapeutic intervention in order to improve on the following deficits Pain;Difficulty walking;Decreased activity tolerance   Rehab Potential Good   PT Frequency 2x / week   PT Duration 6 weeks   PT Treatment/Interventions Therapeutic activities;Therapeutic exercise;Dry needling;Patient/family education;Neuromuscular re-education;Moist Heat;Manual techniques   PT Next Visit Plan Manual therapy for pain control and for tissue relaxation.    PT Home Exercise Plan lumbar rotation, bridging, SKTC 2 x per day, added pelvic tilts   Consulted and Agree with  Plan of Care Patient        Problem List There are no active problems to display for this patient.   Rian Busche, PT, MPT, GCS  11/04/2015, 3:59 PM  [place] [facility] [address], [place], [address] Phone: [phone]   Fax:  [phone]  Name: Jasmine Welch MRN: [id number] Date of Birth: 1946/05/10

## 2015-11-08 ENCOUNTER — Ambulatory Visit: Payer: Medicare PPO | Admitting: Physical Therapy

## 2015-11-08 DIAGNOSIS — M5441 Lumbago with sciatica, right side: Secondary | ICD-10-CM

## 2015-11-08 NOTE — Therapy (Signed)
[place] [facility] [address] [place], [address] Phone: [phone]   Fax:  [phone]  Physical Therapy Treatment  Patient Details  Name: PROVIDENCE HAGGERTY MRN: [id number] Date of Birth: 03/14/1946 Referring Provider: Rachelle Hora  Encounter Date: 11/08/2015      PT End of Session - 11/08/15 1547    Visit Number 8   Number of Visits 13   Date for PT Re-Evaluation 11/19/15   Authorization Type 7   Authorization Time Period 10   PT Start Time 1515   PT Stop Time 1600   PT Time Calculation (min) 45 min   Activity Tolerance Patient tolerated treatment well;No increased pain   Behavior During Therapy [facility] for tasks assessed/performed      Past Medical History  Diagnosis Date  . Hypertension   . Anemia   . Diabetes mellitus without complication   . Gastric ulcer due to Helicobacter pylori   . Fatty liver   . Heart murmur     Past Surgical History  Procedure Laterality Date  . Mamoplastic reduction    . Abdominal hysterectomy    . Tubal ligation    . Tonsillectomy    . Carpel tunnel    . Cholecystectomy    . Colonoscopy N/A 05/25/2015    Procedure: COLONOSCOPY;  Surgeon: Lollie Sails, MD;  Location: [facility] ENDOSCOPY;  Service: Endoscopy;  Laterality: N/A;    There were no vitals filed for this visit.  Visit Diagnosis:  Right-sided low back pain with right-sided sciatica      Subjective Assessment - 11/08/15 1545    Subjective Patient reports she is sore, but that she has been doing more the last few days.    Limitations Walking;Standing;Sitting;Lifting;House hold activities   Patient Stated Goals to have decreased pain   Currently in Pain? Yes   Pain Location Back   Pain Orientation Right;Posterior   Pain Descriptors / Indicators Sore   Pain Type Chronic pain   Pain Onset More than a month ago   Pain Frequency Intermittent   Multiple Pain Sites No         Treatment to include,    Manual therapy to right side lumbar spine for trigger point relaxation and pain control.  Exercises to include:  bridging, lumbar rotation, and SKTC x 20 reps each bilaterally Cues given for proper technique.  Seated forward and lateral flexion x 10-15 reps. standing twisting to each side x 15 reps. Cues given for proper form.             PT Education - 11/08/15 1546    Education provided Yes   Education Details stretching, avoiding activities and stretches that aggrivate her pain.    Person(s) Educated Patient   Methods Explanation   Comprehension Verbalized understanding             PT Long Term Goals - 10/08/15 1124    PT LONG TERM GOAL #1   Title Patient will decrease reported pain to < 2/10 with aggrivating activities.    Baseline 4/10   Time 6   Period Weeks   Status New   PT LONG TERM GOAL #2   Title Patient will demonstrate decreased pain with functional activities by a decreased modified Oswestry score of < 20%   Baseline 28%   Time 6   Period Weeks   Status New               Plan -  11/08/15 1547    Clinical Impression Statement Patient continues to have right sided back soreness. Patient is independent with exercises/stretches to manage pain. Requires continued MT for improvement in symptoms.    Pt will benefit from skilled therapeutic intervention in order to improve on the following deficits Pain;Difficulty walking;Decreased activity tolerance   Rehab Potential Good   PT Frequency 2x / week   PT Duration 6 weeks   PT Treatment/Interventions Therapeutic activities;Therapeutic exercise;Dry needling;Patient/family education;Neuromuscular re-education;Moist Heat;Manual techniques   PT Next Visit Plan Manual therapy for pain control and for tissue relaxation.    PT Home Exercise Plan lumbar rotation, bridging, SKTC 2 x per day, added pelvic tilts ( holding pelvic tilts for now as these aggrivate her pain)   Consulted and Agree with Plan of Care  Patient        Problem List There are no active problems to display for this patient.   Miamor Ayler, PT, MPT, GCS 11/08/2015, 3:51 PM  [place] [facility] [address], [place], [address] Phone: [phone]   Fax:  [phone]  Name: ARLENE SLUTSKY MRN: [id number] Date of Birth: 01-06-1946

## 2015-11-11 ENCOUNTER — Ambulatory Visit: Payer: Medicare PPO | Admitting: Physical Therapy

## 2015-11-11 DIAGNOSIS — M5441 Lumbago with sciatica, right side: Secondary | ICD-10-CM | POA: Diagnosis not present

## 2015-11-11 NOTE — Therapy (Signed)
[place] [facility] [address], [place], [address] Phone: [phone]   Fax:  [phone]  Physical Therapy Treatment  Patient Details  Name: Jasmine Welch MRN: [id number] Date of Birth: [birth year] Referring Provider: Rachelle Hora  Encounter Date: 11/11/2015      PT End of Session - 11/11/15 1623    Visit Number 9   Number of Visits 13   Date for PT Re-Evaluation 11/19/15   Authorization Type 8   Authorization Time Period 10   PT Start Time 1515   PT Stop Time 1600   PT Time Calculation (min) 45 min   Activity Tolerance Patient tolerated treatment well;No increased pain   Behavior During Therapy [facility] for tasks assessed/performed      Past Medical History  Diagnosis Date  . Hypertension   . Anemia   . Diabetes mellitus without complication   . Gastric ulcer due to Helicobacter pylori   . Fatty liver   . Heart murmur     Past Surgical History  Procedure Laterality Date  . Mamoplastic reduction    . Abdominal hysterectomy    . Tubal ligation    . Tonsillectomy    . Carpel tunnel    . Cholecystectomy    . Colonoscopy N/A 05/25/2015    Procedure: COLONOSCOPY;  Surgeon: Lollie Sails, MD;  Location: [facility] ENDOSCOPY;  Service: Endoscopy;  Laterality: N/A;    There were no vitals filed for this visit.  Visit Diagnosis:  Right-sided low back pain with right-sided sciatica      Subjective Assessment - 11/11/15 1622    Subjective Patient reports that she only feels back pain occasionally.    Limitations Walking;Standing;Sitting;Lifting;House hold activities   Patient Stated Goals to have decreased pain   Pain Location Back   Pain Orientation Right;Posterior   Multiple Pain Sites No         Treatment to include,   Manual therapy to right side lumbar spine for trigger point relaxation and pain control.  Exercises to include: bridging, lumbar rotation, and SKTC x 20 reps each  bilaterally Cues given for proper technique. Seated forward and lateral flexion x 10-15 reps. standing twisting to each side x 15 reps. Cues given for proper form.             PT Education - 11/11/15 1622    Education provided Yes   Education Details stretching, exercises   Person(s) Educated Patient   Methods Explanation;Demonstration   Comprehension Verbalized understanding;Returned demonstration             PT Long Term Goals - 10/08/15 1124    PT LONG TERM GOAL #1   Title Patient will decrease reported pain to < 2/10 with aggrivating activities.    Baseline 4/10   Time 6   Period Weeks   Status New   PT LONG TERM GOAL #2   Title Patient will demonstrate decreased pain with functional activities by a decreased modified Oswestry score of < 20%   Baseline 28%   Time 6   Period Weeks   Status New               Plan - 11/11/15 1623    Clinical Impression Statement Patient has decreased back pain reported, continues to have tenderness on palpation, but decreased limitations with daily activities.    Pt will benefit from skilled therapeutic intervention in order to improve on the following deficits Pain;Difficulty walking;Decreased activity tolerance  Rehab Potential Good   PT Frequency 2x / week   PT Duration 6 weeks   PT Treatment/Interventions Therapeutic activities;Therapeutic exercise;Dry needling;Patient/family education;Neuromuscular re-education;Moist Heat;Manual techniques   PT Next Visit Plan Manual therapy for pain control and for tissue relaxation.    PT Home Exercise Plan lumbar rotation, bridging, SKTC 2 x per day, added pelvic tilts ( holding pelvic tilts for now as these aggrivate her pain)   Consulted and Agree with Plan of Care Patient        Problem List There are no active problems to display for this patient.   Nester Bachus, PT, MPT, GCS 11/11/2015, 4:25 PM  [place] [facility] [address] [place], [address] Phone: [phone]   Fax:  [phone]  Name: Jasmine Welch MRN: [id number] Date of Birth: [birth year]

## 2015-11-15 ENCOUNTER — Ambulatory Visit: Payer: Medicare PPO | Admitting: Physical Therapy

## 2015-11-15 DIAGNOSIS — M5441 Lumbago with sciatica, right side: Secondary | ICD-10-CM | POA: Diagnosis not present

## 2015-11-15 NOTE — Therapy (Signed)
[facility] [address], [place], [address] Phone: [phone]   Fax:  [phone]  Physical Therapy Treatment  Patient Details  Name: Jasmine Welch MRN: [id number] Date of Birth: May 18, 1946 Referring Provider: Rachelle Hora  Encounter Date: 11/15/2015      PT End of Session - 11/15/15 1422    Visit Number 10   Number of Visits 13   Date for PT Re-Evaluation 11/19/15   Authorization Type 9   Authorization Time Period 10   PT Start Time [id number]   PT Stop Time 1430   PT Time Calculation (min) 45 min   Activity Tolerance Patient tolerated treatment well;No increased pain   Behavior During Therapy [facility] for tasks assessed/performed      Past Medical History  Diagnosis Date  . Hypertension   . Anemia   . Diabetes mellitus without complication   . Gastric ulcer due to Helicobacter pylori   . Fatty liver   . Heart murmur     Past Surgical History  Procedure Laterality Date  . Mamoplastic reduction    . Abdominal hysterectomy    . Tubal ligation    . Tonsillectomy    . Carpel tunnel    . Cholecystectomy    . Colonoscopy N/A 05/25/2015    Procedure: COLONOSCOPY;  Surgeon: Lollie Sails, MD;  Location: [facility] ENDOSCOPY;  Service: Endoscopy;  Laterality: N/A;    There were no vitals filed for this visit.  Visit Diagnosis:  Right-sided low back pain with right-sided sciatica      Subjective Assessment - 11/15/15 1420    Subjective Patient reports back pain is about the same.    Limitations Walking;Standing;Sitting;Lifting;House hold activities   Patient Stated Goals to have decreased pain   Currently in Pain? Yes   Pain Score 3    Pain Location Back   Pain Orientation Right;Posterior   Pain Descriptors / Indicators Sore;Tender   Pain Type Chronic pain   Pain Onset More than a month ago   Pain Frequency Intermittent   Multiple Pain Sites No          Treatment to include,    Estim on premod setting to right low back with MHP x 10 min.   Manual therapy to right side lumbar spine for trigger point relaxation and pain control.  Exercises to include: bridging, lumbar rotation, and SKTC x 20 reps each bilaterally Cues given for proper technique. Seated forward and lateral flexion x 10-15 reps. standing twisting to each side x 15 reps. Cues given for proper form.                           PT Education - 11/15/15 1421    Education provided Yes   Education Details stretching and exercises for pain control. Estim and it's benefits.    Person(s) Educated Patient   Methods Explanation   Comprehension Verbalized understanding             PT Long Term Goals - 10/08/15 1124    PT LONG TERM GOAL #1   Title Patient will decrease reported pain to < 2/10 with aggrivating activities.    Baseline 4/10   Time 6   Period Weeks   Status New   PT LONG TERM GOAL #2   Title Patient will demonstrate decreased pain with functional activities by a decreased modified Oswestry score of < 20%   Baseline 28%  Time 6   Period Weeks   Status New               Plan - 11/15/15 1423    Clinical Impression Statement Patient maintaining pain with palpation or certain movements. Pt had good response to estim with MHP.    Pt will benefit from skilled therapeutic intervention in order to improve on the following deficits Pain;Difficulty walking;Decreased activity tolerance   Rehab Potential Good   PT Frequency 2x / week   PT Duration 6 weeks   PT Treatment/Interventions Therapeutic activities;Therapeutic exercise;Dry needling;Patient/family education;Neuromuscular re-education;Moist Heat;Manual techniques   PT Next Visit Plan Manual therapy for pain control and for tissue relaxation.    PT Home Exercise Plan lumbar rotation, bridging, SKTC 2 x per day, added pelvic tilts ( holding pelvic tilts for now as these aggrivate her pain)   Consulted and  Agree with Plan of Care Patient        Problem List There are no active problems to display for this patient.   Raeli Wiens, PT, MPT, GCS 11/15/2015, 2:26 PM  [place] [facility] [address] [place], [address] Phone: [phone]   Fax:  [phone]  Name: Jasmine Welch MRN: [id number] Date of Birth: 1946/01/02

## 2015-11-18 ENCOUNTER — Ambulatory Visit: Payer: Medicare PPO | Admitting: Physical Therapy

## 2015-11-18 DIAGNOSIS — M5441 Lumbago with sciatica, right side: Secondary | ICD-10-CM | POA: Diagnosis not present

## 2015-11-18 NOTE — Therapy (Signed)
[place] [facility] [address] [place], [address] Phone: [phone]   Fax:  [phone]  Physical Therapy Treatment/ Discharge Note  Patient Details  Name: Jasmine Welch MRN: [id number] Date of Birth: 12-31-1945 Referring Provider: Rachelle Hora  Encounter Date: 11/18/2015      PT End of Session - 11/18/15 1403    Visit Number 11   Number of Visits 13   Date for PT Re-Evaluation 11/19/15   Authorization Type 10   Authorization Time Period 10   PT Start Time 0569   PT Stop Time 1430   PT Time Calculation (min) 45 min   Activity Tolerance Patient tolerated treatment well;No increased pain   Behavior During Therapy [facility] for tasks assessed/performed      Past Medical History  Diagnosis Date  . Hypertension   . Anemia   . Diabetes mellitus without complication   . Gastric ulcer due to Helicobacter pylori   . Fatty liver   . Heart murmur     Past Surgical History  Procedure Laterality Date  . Mamoplastic reduction    . Abdominal hysterectomy    . Tubal ligation    . Tonsillectomy    . Carpel tunnel    . Cholecystectomy    . Colonoscopy N/A 05/25/2015    Procedure: COLONOSCOPY;  Surgeon: Lollie Sails, MD;  Location: [facility] ENDOSCOPY;  Service: Endoscopy;  Laterality: N/A;    There were no vitals filed for this visit.  Visit Diagnosis:  Right-sided low back pain with right-sided sciatica      Subjective Assessment - 11/18/15 1358    Subjective Patient states that the estim treatment seemed to help. Feeling better today.    Limitations Walking;Standing;Sitting;Lifting;House hold activities   Patient Stated Goals to have decreased pain   Currently in Pain? No/denies   Pain Location Back   Pain Orientation Right;Posterior   Pain Descriptors / Indicators Sore;Tender   Pain Type Chronic pain   Pain Onset More than a month ago   Pain Frequency Intermittent   Multiple Pain Sites No           Treatment to include,   Estim on premod setting to right low back with MHP x 15 min.   Manual therapy to right side lumbar spine for trigger point relaxation and pain control.  Exercises to include: lumbar rotation, and SKTC x 20 reps each bilaterally Cues given for proper technique.   Reviewed goals and re-assessed Modified Oswestry for a score of 24%.           PT Education - 11/18/15 1403    Education provided Yes   Education Details TENS units available at drugstores.    Person(s) Educated Patient   Methods Explanation   Comprehension Verbalized understanding             PT Long Term Goals - 11/18/15 1541    PT LONG TERM GOAL #1   Title Patient will decrease reported pain to < 2/10 with aggrivating activities.    Baseline 4/10   Time 6   Period Weeks   Status Achieved   PT LONG TERM GOAL #2   Title Patient will demonstrate decreased pain with functional activities by a decreased modified Oswestry score of < 20%   Baseline 28%- at discharge reduced to 24%   Time 6   Period Weeks   Status Partially Met  G-Codes - 11/18/15 1540    Functional Assessment Tool Used Modified Oswestry, clinical judgement   Functional Limitation Mobility: Walking and moving around   Mobility: Walking and Moving Around Current Status [phone]) At least 1 percent but less than 20 percent impaired, limited or restricted   Mobility: Walking and Moving Around Goal Status [phone]) At least 1 percent but less than 20 percent impaired, limited or restricted   Mobility: Walking and Moving Around Discharge Status [phone]) At least 1 percent but less than 20 percent impaired, limited or restricted      Problem List There are no active problems to display for this patient.   Salma Walrond, PT, MPT, GCS 11/18/2015, 3:43 PM  [place] [facility] [address], [place], [address] Phone:  [phone]   Fax:  [phone]  Name: Jasmine Welch MRN: [id number] Date of Birth: Jan 11, 1946

## 2016-06-21 ENCOUNTER — Other Ambulatory Visit: Payer: Self-pay | Admitting: Family Medicine

## 2016-06-21 DIAGNOSIS — Z1231 Encounter for screening mammogram for malignant neoplasm of breast: Secondary | ICD-10-CM

## 2016-07-06 ENCOUNTER — Ambulatory Visit
Admission: RE | Admit: 2016-07-06 | Discharge: 2016-07-06 | Disposition: A | Discharge status: Home / Self Care | Payer: Medicare Other | Source: Ambulatory Visit | Attending: Family Medicine | Admitting: Family Medicine

## 2016-07-06 ENCOUNTER — Other Ambulatory Visit: Payer: Self-pay | Admitting: Family Medicine

## 2016-07-06 DIAGNOSIS — Z1231 Encounter for screening mammogram for malignant neoplasm of breast: Secondary | ICD-10-CM

## 2017-07-31 ENCOUNTER — Other Ambulatory Visit: Payer: Self-pay | Admitting: Family Medicine

## 2017-07-31 DIAGNOSIS — Z1231 Encounter for screening mammogram for malignant neoplasm of breast: Secondary | ICD-10-CM

## 2017-08-07 ENCOUNTER — Ambulatory Visit
Admission: RE | Admit: 2017-08-07 | Discharge: 2017-08-07 | Disposition: A | Discharge status: Home / Self Care | Payer: Medicare Other | Source: Ambulatory Visit | Attending: Family Medicine | Admitting: Family Medicine

## 2017-08-07 DIAGNOSIS — Z1231 Encounter for screening mammogram for malignant neoplasm of breast: Secondary | ICD-10-CM | POA: Insufficient documentation

## 2018-09-02 ENCOUNTER — Other Ambulatory Visit: Payer: Self-pay | Admitting: Family Medicine

## 2018-09-02 DIAGNOSIS — Z1231 Encounter for screening mammogram for malignant neoplasm of breast: Secondary | ICD-10-CM

## 2018-09-05 ENCOUNTER — Encounter: Payer: Self-pay | Admitting: Emergency Medicine

## 2018-09-05 ENCOUNTER — Emergency Department: Payer: Medicare Other

## 2018-09-05 ENCOUNTER — Emergency Department
Admission: EM | Admit: 2018-09-05 | Discharge: 2018-09-05 | Disposition: A | Discharge status: Home / Self Care | Payer: Medicare Other | Attending: Emergency Medicine | Admitting: Emergency Medicine

## 2018-09-05 ENCOUNTER — Other Ambulatory Visit: Payer: Self-pay

## 2018-09-05 DIAGNOSIS — Z7984 Long term (current) use of oral hypoglycemic drugs: Secondary | ICD-10-CM | POA: Insufficient documentation

## 2018-09-05 DIAGNOSIS — E119 Type 2 diabetes mellitus without complications: Secondary | ICD-10-CM | POA: Diagnosis not present

## 2018-09-05 DIAGNOSIS — R0789 Other chest pain: Secondary | ICD-10-CM | POA: Diagnosis present

## 2018-09-05 DIAGNOSIS — I10 Essential (primary) hypertension: Secondary | ICD-10-CM | POA: Insufficient documentation

## 2018-09-05 DIAGNOSIS — Z79899 Other long term (current) drug therapy: Secondary | ICD-10-CM | POA: Diagnosis not present

## 2018-09-05 DIAGNOSIS — K2100 Gastro-esophageal reflux disease with esophagitis, without bleeding: Secondary | ICD-10-CM

## 2018-09-05 DIAGNOSIS — Z7982 Long term (current) use of aspirin: Secondary | ICD-10-CM | POA: Diagnosis not present

## 2018-09-05 LAB — CBC
HEMATOCRIT: 37.9 % (ref 36.0–46.0)
HEMOGLOBIN: 12.9 g/dL (ref 12.0–15.0)
MCH: 31.2 pg (ref 26.0–34.0)
MCHC: 34 g/dL (ref 30.0–36.0)
MCV: 91.8 fL (ref 80.0–100.0)
Platelets: 265 10*3/uL (ref 150–400)
RBC: 4.13 MIL/uL (ref 3.87–5.11)
RDW: 12.8 % (ref 11.5–15.5)
WBC: 8.3 10*3/uL (ref 4.0–10.5)
nRBC: 0 % (ref 0.0–0.2)

## 2018-09-05 LAB — HEPATIC FUNCTION PANEL
ALBUMIN: 4.4 g/dL (ref 3.5–5.0)
ALT: 24 U/L (ref 0–44)
AST: 26 U/L (ref 15–41)
Alkaline Phosphatase: 94 U/L (ref 38–126)
TOTAL PROTEIN: 7.7 g/dL (ref 6.5–8.1)
Total Bilirubin: 0.6 mg/dL (ref 0.3–1.2)

## 2018-09-05 LAB — TROPONIN I

## 2018-09-05 LAB — BASIC METABOLIC PANEL
Anion gap: 12 (ref 5–15)
BUN: 23 mg/dL (ref 8–23)
CHLORIDE: 101 mmol/L (ref 98–111)
CO2: 28 mmol/L (ref 22–32)
CREATININE: 0.91 mg/dL (ref 0.44–1.00)
Calcium: 9.8 mg/dL (ref 8.9–10.3)
GFR calc Af Amer: >60 mL/min (ref >60)
GFR calc non Af Amer: >60 mL/min (ref >60)
GLUCOSE: 93 mg/dL (ref 70–99)
Potassium: 3.5 mmol/L (ref 3.5–5.1)
SODIUM: 141 mmol/L (ref 135–145)

## 2018-09-05 LAB — LIPASE, BLOOD
LIPASE: 35 U/L (ref 11–51)
Lipase: 35 U/L (ref 11–51)

## 2018-09-05 MED ORDER — PANTOPRAZOLE SODIUM 40 MG PO TBEC: 40.0000 mg | DELAYED_RELEASE_TABLET | Freq: Every day | ORAL | 1 refills | Status: AC

## 2018-09-05 MED ORDER — GI COCKTAIL ~~LOC~~
30.0000 mL | Freq: Once | ORAL | Status: AC
  Administered 2018-09-05: 30 mL via ORAL

## 2018-09-05 MED ORDER — GI COCKTAIL ~~LOC~~
30.0000 mL | Freq: Once | ORAL | Status: AC
  Administered 2018-09-05: 30 mL via ORAL
  Filled 2018-09-05: qty 30

## 2018-09-05 MED ORDER — GI COCKTAIL ~~LOC~~
ORAL | Status: AC
  Administered 2018-09-05: 30 mL via ORAL
  Filled 2018-09-05: qty 30

## 2018-09-05 MED ORDER — IOHEXOL 350 MG/ML SOLN
100.0000 mL | Freq: Once | INTRAVENOUS | Status: AC | PRN
  Administered 2018-09-05: 100 mL via INTRAVENOUS

## 2018-09-05 NOTE — ED Notes (Signed)
Reviewed discharge instructions, follow-up care, and prescriptions with patient. Patient verbalized understanding of all information reviewed. Patient stable, with no distress noted at this time.    

## 2018-09-05 NOTE — ED Triage Notes (Signed)
Pt to ED via POV with c/o " heart burn from upper epigastric area and pain radiating into back" x 4days. VSS , NAD noted

## 2018-09-05 NOTE — ED Provider Notes (Signed)
[facility] Emergency Department Provider Note  Time seen: 6:17 PM  I have reviewed the triage vital signs and the nursing notes.   HISTORY  Chief Complaint Chest Pain    HPI Jasmine Welch is a 72 y.o. female with a past medical history of anemia, diabetes, gastric reflux, hypertension presents to the emergency department for chest pain.  According to the patient for the past 4 days she has been experiencing a burning sharp chest pain that radiates to her back.  States it starts in her epigastrium and has progressed into the chest with radiation to the back.  Denies any pleuritic pain.  Denies any diaphoresis or shortness of breath.  Does state mild nausea.  States a history of reflux has tried over-the-counter medications such as omeprazole and Tums over the past 4 days without any improvement or change in her discomfort.  She called her doctor today who recommended they come to the emergency department for evaluation.  Describes a burning pain as moderate currently.   Past Medical History:  Diagnosis Date  . Anemia   . Diabetes mellitus without complication ([place])   . Fatty liver   . Gastric ulcer due to Helicobacter pylori   . Heart murmur   . Hypertension     There are no active problems to display for this patient.   Past Surgical History:  Procedure Laterality Date  . ABDOMINAL HYSTERECTOMY    . carpel tunnel    . CHOLECYSTECTOMY    . COLONOSCOPY N/A 05/25/2015   Procedure: COLONOSCOPY;  Surgeon: Lollie Sails, MD;  Location: [facility] ENDOSCOPY;  Service: Endoscopy;  Laterality: N/A;  . mamoplastic reduction    . REDUCTION MAMMAPLASTY Bilateral 1973  . TONSILLECTOMY    . TUBAL LIGATION      Prior to Admission medications   Medication Sig Start Date End Date Taking? Authorizing Provider  amLODipine (NORVASC) 10 MG tablet Take 10 mg by mouth daily.    [provider]  aspirin (ASPIRIN EC) 81 MG EC tablet Take 81 mg by mouth daily.  Swallow whole.    [provider]  cetirizine (ZYRTEC) 10 MG tablet Take 10 mg by mouth daily.    [provider]  cyclobenzaprine (FLEXERIL) 5 MG tablet Take 5 mg by mouth 3 (three) times daily as needed for muscle spasms.    [provider]  dicyclomine (BENTYL) 10 MG capsule Take 10 mg by mouth daily.    [provider]  estrogen-methylTESTOSTERone 0.625-1.25 MG per tablet Take 1 tablet by mouth daily.    [provider]  etodolac (LODINE) 500 MG tablet Take 500 mg by mouth 2 (two) times daily.    [provider]  fluticasone (FLONASE) 50 MCG/ACT nasal spray Place 2 sprays into both nostrils daily.    [provider]  metFORMIN (GLUCOPHAGE) 500 MG tablet Take by mouth 2 (two) times daily with a meal.    [provider]  omeprazole (PRILOSEC) 20 MG capsule Take 20 mg by mouth daily.    [provider]  simvastatin (ZOCOR) 10 MG tablet Take 10 mg by mouth daily.    [provider]  trandolapril (MAVIK) 4 MG tablet Take 4 mg by mouth daily.    [provider]  triamterene-hydrochlorothiazide (MAXZIDE-25) 37.5-25 MG per tablet Take 1 tablet by mouth daily.    [provider]    Allergies  Allergen Reactions  . Augmentin [Amoxicillin-Pot Clavulanate] Other (See Comments)  . Benzodiazepines Other (  See Comments)    Family History  Problem Relation Age of Onset  . Breast cancer Cousin 106  . Breast cancer Cousin 1    Social History Social History   Tobacco Use  . Smoking status: Never Smoker  . Smokeless tobacco: Never Used  Substance Use Topics  . Alcohol use: No  . Drug use: No    Review of Systems Constitutional: Negative for fever. Cardiovascular: Chest pain/burning radiating to her back Respiratory: Negative for shortness of breath. Gastrointestinal: Negative for abdominal pain.  Some nausea.  Negative for vomiting or diarrhea. Genitourinary: Negative for urinary  compaints Musculoskeletal: Negative for leg pain or swelling. Skin: Negative for skin complaints  Neurological: Negative for headache All other ROS negative  ____________________________________________   PHYSICAL EXAM:  VITAL SIGNS: ED Triage Vitals [09/05/18 1417]  Enc Vitals Group     BP (!) 175/88     Pulse Rate 74     Resp 16     Temp 98 F (36.7 C)     Temp Source Oral     SpO2 100 %     Weight 160 lb (72.6 kg)     Height 5\' 1"  (1.549 m)     Head Circumference      Peak Flow      Pain Score 5     Pain Loc      Pain Edu?      Excl. in [place]?    Constitutional: Alert and oriented. Well appearing and in no distress. Eyes: Normal exam ENT   Head: Normocephalic and atraumatic.   Mouth/Throat: Mucous membranes are moist. Cardiovascular: Normal rate, regular rhythm. No murmur Respiratory: Normal respiratory effort without tachypnea nor retractions. Breath sounds are clear  Gastrointestinal: Soft and nontender. No distention.   Musculoskeletal: Nontender with normal range of motion in all extremities. No lower extremity tenderness or edema. Neurologic:  Normal speech and language. No gross focal neurologic deficits Skin:  Skin is warm, dry and intact.  Psychiatric: Mood and affect are normal.   ____________________________________________    EKG  EKG reviewed and interpreted by myself shows normal sinus rhythm at 79 bpm with a narrow QRS, left axis deviation, largely normal intervals, but no concerning ST changes.  ____________________________________________    RADIOLOGY  Chest x-ray is negative  CT angiography negative. ____________________________________________   INITIAL IMPRESSION / ASSESSMENT AND PLAN / ED COURSE  Pertinent labs & imaging results that were available during my care of the patient were reviewed by me and considered in my medical decision making (see chart for details).  Patient presents to the emergency department for burning  chest pain radiating to her back.  Patient is mildly hypertensive currently 160/99.  Differential this time would include ACS, gastric reflux, gastritis, gastric ulcers, esophagitis, aortic dissection, pancreatitis or gallbladder disease.  Overall the patient appears well however given 4 days of symptoms without relief with over-the-counter medications I believe a CT angiography is warranted to rule out pathology such as aortic dissection as the patient is quite hypertensive currently.  Patient agreeable to plan of care.  We will obtain a CT angiography to rule out dissection.  We will check a lipase as well as hepatic function panel.  Reassuringly patient's labs including cardiac enzymes are otherwise normal.  We will dose a GI cocktail monitor for symptom improvement.  Patient agreeable to plan of care.  CT angiography is negative.  Patient states she feels much better after GI cocktail.  Additional labs including lipase, troponin  and LFTs are normal.  Highly suspect this to be GI related either gastritis/esophagitis versus gastric ulcers.  We will place patient on Protonix 40 mg daily.  Patient has a GI specialist that she wishes to follow-up with.  I also discussed over-the-counter Maalox use for the next 1 week.  Patient agreeable to plan of care.  ____________________________________________   FINAL CLINICAL IMPRESSION(S) / ED DIAGNOSES  Chest pain Gastric reflux   Harvest Dark, MD 09/05/18 2011

## 2018-09-05 NOTE — ED Notes (Signed)
ED Provider at bedside. 

## 2018-09-18 ENCOUNTER — Ambulatory Visit
Admission: RE | Admit: 2018-09-18 | Discharge: 2018-09-18 | Disposition: A | Discharge status: Home / Self Care | Payer: Medicare Other | Source: Ambulatory Visit | Attending: Family Medicine | Admitting: Family Medicine

## 2018-09-18 DIAGNOSIS — Z1231 Encounter for screening mammogram for malignant neoplasm of breast: Secondary | ICD-10-CM | POA: Insufficient documentation

## 2018-10-15 ENCOUNTER — Other Ambulatory Visit: Payer: Self-pay | Admitting: Gastroenterology

## 2018-10-15 DIAGNOSIS — R1013 Epigastric pain: Secondary | ICD-10-CM

## 2018-10-15 DIAGNOSIS — R1314 Dysphagia, pharyngoesophageal phase: Secondary | ICD-10-CM

## 2018-10-17 ENCOUNTER — Ambulatory Visit
Admission: RE | Admit: 2018-10-17 | Discharge: 2018-10-17 | Disposition: A | Discharge status: Home / Self Care | Payer: Medicare Other | Source: Ambulatory Visit | Attending: Gastroenterology | Admitting: Gastroenterology

## 2018-10-17 DIAGNOSIS — R1013 Epigastric pain: Secondary | ICD-10-CM

## 2018-10-17 DIAGNOSIS — K219 Gastro-esophageal reflux disease without esophagitis: Secondary | ICD-10-CM | POA: Diagnosis not present

## 2018-10-17 DIAGNOSIS — R1314 Dysphagia, pharyngoesophageal phase: Secondary | ICD-10-CM | POA: Diagnosis present

## 2018-12-12 ENCOUNTER — Encounter: Payer: Self-pay | Admitting: Student

## 2018-12-13 ENCOUNTER — Encounter
Admission: RE | Disposition: A | Discharge status: Home / Self Care | Payer: Self-pay | Source: Home / Self Care | Attending: Gastroenterology

## 2018-12-13 ENCOUNTER — Ambulatory Visit: Payer: Medicare Other | Admitting: Certified Registered Nurse Anesthetist

## 2018-12-13 ENCOUNTER — Encounter: Payer: Self-pay | Admitting: *Deleted

## 2018-12-13 ENCOUNTER — Ambulatory Visit
Admission: RE | Admit: 2018-12-13 | Discharge: 2018-12-13 | Disposition: A | Discharge status: Home / Self Care | Payer: Medicare Other | Attending: Gastroenterology | Admitting: Gastroenterology

## 2018-12-13 DIAGNOSIS — Z8719 Personal history of other diseases of the digestive system: Secondary | ICD-10-CM | POA: Insufficient documentation

## 2018-12-13 DIAGNOSIS — E119 Type 2 diabetes mellitus without complications: Secondary | ICD-10-CM | POA: Insufficient documentation

## 2018-12-13 DIAGNOSIS — Z7984 Long term (current) use of oral hypoglycemic drugs: Secondary | ICD-10-CM | POA: Diagnosis not present

## 2018-12-13 DIAGNOSIS — I10 Essential (primary) hypertension: Secondary | ICD-10-CM | POA: Insufficient documentation

## 2018-12-13 DIAGNOSIS — R1310 Dysphagia, unspecified: Secondary | ICD-10-CM | POA: Insufficient documentation

## 2018-12-13 DIAGNOSIS — Z7982 Long term (current) use of aspirin: Secondary | ICD-10-CM | POA: Insufficient documentation

## 2018-12-13 DIAGNOSIS — K2950 Unspecified chronic gastritis without bleeding: Secondary | ICD-10-CM | POA: Diagnosis not present

## 2018-12-13 DIAGNOSIS — K219 Gastro-esophageal reflux disease without esophagitis: Secondary | ICD-10-CM | POA: Diagnosis present

## 2018-12-13 DIAGNOSIS — Z881 Allergy status to other antibiotic agents status: Secondary | ICD-10-CM | POA: Diagnosis not present

## 2018-12-13 DIAGNOSIS — Z880 Allergy status to penicillin: Secondary | ICD-10-CM | POA: Insufficient documentation

## 2018-12-13 DIAGNOSIS — E785 Hyperlipidemia, unspecified: Secondary | ICD-10-CM | POA: Insufficient documentation

## 2018-12-13 DIAGNOSIS — Z79899 Other long term (current) drug therapy: Secondary | ICD-10-CM | POA: Diagnosis not present

## 2018-12-13 HISTORY — DX: Pain in right ankle and joints of right foot: M25.571

## 2018-12-13 HISTORY — DX: Polyp of colon: K63.5

## 2018-12-13 HISTORY — DX: Gastro-esophageal reflux disease without esophagitis: K21.9

## 2018-12-13 HISTORY — DX: Personal history of other diseases of the digestive system: Z87.19

## 2018-12-13 HISTORY — DX: Hyperlipidemia, unspecified: E78.5

## 2018-12-13 HISTORY — PX: ESOPHAGOGASTRODUODENOSCOPY: SHX5428

## 2018-12-13 SURGERY — EGD (ESOPHAGOGASTRODUODENOSCOPY)
Anesthesia: General

## 2018-12-13 MED ORDER — ONDANSETRON HCL 4 MG/2ML IJ SOLN
INTRAMUSCULAR | Status: DC | PRN
  Administered 2018-12-13: 4 mg via INTRAVENOUS

## 2018-12-13 MED ORDER — FENTANYL CITRATE (PF) 100 MCG/2ML IJ SOLN
INTRAMUSCULAR | Status: DC | PRN
  Administered 2018-12-13: 50 ug via INTRAVENOUS

## 2018-12-13 MED ORDER — LIDOCAINE HCL (CARDIAC) PF 100 MG/5ML IV SOSY
PREFILLED_SYRINGE | INTRAVENOUS | Status: DC | PRN
  Administered 2018-12-13: 100 mg via INTRAVENOUS

## 2018-12-13 MED ORDER — PHENYLEPHRINE HCL 10 MG/ML IJ SOLN
INTRAMUSCULAR | Status: DC | PRN
  Administered 2018-12-13 (×2): 100 ug via INTRAVENOUS

## 2018-12-13 MED ORDER — SODIUM CHLORIDE 0.9 % IV SOLN
INTRAVENOUS | Status: DC
  Administered 2018-12-13: 08:00:00 via INTRAVENOUS
  Administered 2018-12-13: 1000 mL via INTRAVENOUS

## 2018-12-13 MED ORDER — FENTANYL CITRATE (PF) 100 MCG/2ML IJ SOLN
INTRAMUSCULAR | Status: AC
  Filled 2018-12-13: qty 2

## 2018-12-13 MED ORDER — PROPOFOL 10 MG/ML IV BOLUS
INTRAVENOUS | Status: DC | PRN
  Administered 2018-12-13: 50 mg via INTRAVENOUS
  Administered 2018-12-13: 40 mg via INTRAVENOUS
  Administered 2018-12-13 (×3): 20 mg via INTRAVENOUS

## 2018-12-13 MED ORDER — PROPOFOL 10 MG/ML IV BOLUS
INTRAVENOUS | Status: AC
  Filled 2018-12-13: qty 80

## 2018-12-13 NOTE — Anesthesia Preprocedure Evaluation (Signed)
Anesthesia Evaluation  Patient identified by MRN, date of birth, ID band Patient awake    Reviewed: Allergy & Precautions, NPO status , Patient's Chart, lab work & pertinent test results  History of Anesthesia Complications Negative for: history of anesthetic complications  Airway Mallampati: II  TM Distance: >3 FB Neck ROM: Full    Dental no notable dental hx.    Pulmonary neg pulmonary ROS, neg sleep apnea, neg COPD,    breath sounds clear to auscultation- rhonchi (-) wheezing      Cardiovascular Exercise Tolerance: Good hypertension, Pt. on medications (-) CAD, (-) Past MI, (-) Cardiac Stents and (-) CABG  Rhythm:Regular Rate:Normal - Systolic murmurs and - Diastolic murmurs    Neuro/Psych neg Seizures negative neurological ROS  negative psych ROS   GI/Hepatic Neg liver ROS, PUD, GERD  ,  Endo/Other  diabetes, Oral Hypoglycemic Agents  Renal/GU negative Renal ROS     Musculoskeletal negative musculoskeletal ROS (+)   Abdominal (+) - obese,   Peds  Hematology  (+) anemia ,   Anesthesia Other Findings Past Medical History: No date: Anemia No date: Diabetes mellitus without complication (HCC) No date: Fatty liver No date: Gastric ulcer due to Helicobacter pylori No date: GERD (gastroesophageal reflux disease) No date: History of esophageal stricture No date: Hyperlipidemia No date: Hyperplastic colon polyp No date: Hypertension No date: Right ankle pain   Reproductive/Obstetrics                             Anesthesia Physical Anesthesia Plan  ASA: II  Anesthesia Plan: General   Post-op Pain Management:    Induction: Intravenous  PONV Risk Score and Plan: 2 and Propofol infusion  Airway Management Planned: Natural Airway  Additional Equipment:   Intra-op Plan:   Post-operative Plan:   Informed Consent: I have reviewed the patients History and Physical, chart, labs  and discussed the procedure including the risks, benefits and alternatives for the proposed anesthesia with the patient or authorized representative who has indicated his/her understanding and acceptance.     Dental advisory given  Plan Discussed with: CRNA and Anesthesiologist  Anesthesia Plan Comments:         Anesthesia Quick Evaluation

## 2018-12-13 NOTE — H&P (Signed)
Outpatient short stay form Pre-procedure 12/13/2018 7:36 AM Lollie Sails MD  Primary Physician: Dr. Juluis Pitch  Reason for visit: EGD  History of present illness: Patient is a 73 year old female presenting today for an EGD due to some complaints of dysphagia and severe gastroesophageal reflux.  She states that this was much worse several months ago and currently her symptoms are about a 2 out of 10.  He does not regurgitate foods although feels that at times things will go down slowly.  She is taking a single dose proton pump inhibitor once daily.  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  She takes no aspirin or blood thinning agent.    Current Facility-Administered Medications:  .  0.9 %  sodium chloride infusion, , Intravenous, Continuous, Lollie Sails, MD, Last Rate: 20 mL/hr at 12/13/18 0720, 1,000 mL at 12/13/18 0720  Medications Prior to Admission  Medication Sig Dispense Refill Last Dose  . amLODipine (NORVASC) 10 MG tablet Take 10 mg by mouth daily.   12/12/2018 at Unknown time  . cetirizine (ZYRTEC) 10 MG tablet Take 10 mg by mouth daily.   12/12/2018 at Unknown time  . dicyclomine (BENTYL) 10 MG capsule Take 10 mg by mouth daily.   12/12/2018 at Unknown time  . metFORMIN (GLUCOPHAGE) 500 MG tablet Take by mouth 2 (two) times daily with a meal.   12/12/2018 at Unknown time  . metoprolol succinate (TOPROL-XL) 50 MG 24 hr tablet Take 50 mg by mouth daily. Take with or immediately following a meal.   12/12/2018 at Unknown time  . pantoprazole (PROTONIX) 40 MG tablet Take 1 tablet (40 mg total) by mouth daily. 30 tablet 1 12/12/2018 at Unknown time  . simvastatin (ZOCOR) 10 MG tablet Take 10 mg by mouth daily.   12/12/2018 at Unknown time  . trandolapril (MAVIK) 4 MG tablet Take 4 mg by mouth daily.   12/12/2018 at Unknown time  . triamterene-hydrochlorothiazide (MAXZIDE-25)  37.5-25 MG per tablet Take 1 tablet by mouth daily.   12/12/2018 at Unknown time  . vitamin B-12 (CYANOCOBALAMIN) 1000 MCG tablet Take 1,000 mcg by mouth daily.   Past Week at Unknown time  . aspirin (ASPIRIN EC) 81 MG EC tablet Take 81 mg by mouth daily. Swallow whole.   Not Taking at Unknown time  . cyclobenzaprine (FLEXERIL) 5 MG tablet Take 5 mg by mouth 3 (three) times daily as needed for muscle spasms.    at prn  . doxycycline (VIBRAMYCIN) 100 MG capsule Take 100 mg by mouth 2 (two) times daily.   Completed Course at Unknown time  . estrogen-methylTESTOSTERone 0.625-1.25 MG per tablet Take 1 tablet by mouth daily.   Completed Course at Unknown time  . etodolac (LODINE) 500 MG tablet Take 500 mg by mouth 2 (two) times daily.   Completed Course at Unknown time  . fluticasone (FLONASE) 50 MCG/ACT nasal spray Place 2 sprays into both nostrils daily.    at prn  . omeprazole (PRILOSEC) 20 MG capsule Take 20 mg by mouth daily.   Completed Course at Unknown time     Allergies  Allergen Reactions  . Augmentin [Amoxicillin-Pot Clavulanate] Other (See Comments)  . Benzodiazepines Other (See Comments)     Past Medical History:  Diagnosis Date  . Anemia   . Diabetes mellitus without complication ([place])   . Fatty liver   . Gastric ulcer due to Helicobacter pylori   .  GERD (gastroesophageal reflux disease)   . History of esophageal stricture   . Hyperlipidemia   . Hyperplastic colon polyp   . Hypertension   . Right ankle pain     Review of systems:      Physical Exam    Heart and lungs: Rhythm without rub or gallop, lungs are bilaterally clear.    HEENT: Normocephalic atraumatic eyes are anicteric    Other:    Pertinant exam for procedure: Soft nontender nondistended bowel sounds positive normoactive.    Planned proceedures: EGD and indicated procedures. I have discussed the risks benefits and complications of procedures to include not limited to bleeding, infection, perforation  and the risk of sedation and the patient wishes to proceed.    Lollie Sails, MD Gastroenterology 12/13/2018  7:36 AM

## 2018-12-13 NOTE — Anesthesia Post-op Follow-up Note (Signed)
Anesthesia QCDR form completed.        

## 2018-12-13 NOTE — Transfer of Care (Signed)
Immediate Anesthesia Transfer of Care Note  Patient: Jasmine Welch  Procedure(s) Performed: ESOPHAGOGASTRODUODENOSCOPY (EGD) (N/A )  Patient Location: PACU and Endoscopy Unit  Anesthesia Type:General  Level of Consciousness: awake, sedated and patient cooperative  Airway & Oxygen Therapy: Patient Spontanous Breathing and Patient connected to nasal cannula oxygen  Post-op Assessment: Report given to RN, Post -op Vital signs reviewed and stable and Patient moving all extremities  Post vital signs: Reviewed and stable  Last Vitals:  Vitals Value Taken Time  BP 110/72 12/13/2018  8:05 AM  Temp 36.2 C 12/13/2018  8:00 AM  Pulse 61 12/13/2018  8:07 AM  Resp 10 12/13/2018  8:07 AM  SpO2 98 % 12/13/2018  8:07 AM  Vitals shown include unvalidated device data.  Last Pain:  Vitals:   12/13/18 0800  TempSrc: Tympanic  PainSc:          Complications: No apparent anesthesia complications

## 2018-12-13 NOTE — Op Note (Signed)
[facility] Gastroenterology Patient Name: Jasmine Welch Procedure Date: 12/13/2018 7:00 AM MRN: [id number] Account #: [id number] Date of Birth: 1946/06/12 Admit Type: Outpatient Age: 73 Room: [facility] ENDO ROOM 1 Gender: Female Note Status: Finalized Procedure:            Upper GI endoscopy Indications:          Dysphagia, Gastro-esophageal reflux disease Providers:            Lollie Sails, MD Referring MD:         Youlanda Roys. Lovie Macadamia, MD (Referring MD) Medicines:            Monitored Anesthesia Care Complications:        No immediate complications. Procedure:            Pre-Anesthesia Assessment:                       - ASA Grade Assessment: II - A patient with mild                        systemic disease.                       After obtaining informed consent, the endoscope was                        passed under direct vision. Throughout the procedure,                        the patient's blood pressure, pulse, and oxygen                        saturations were monitored continuously. The Endoscope                        was introduced through the mouth, and advanced to the                        third part of duodenum. The patient tolerated the                        procedure well. Findings:      The Z-line was regular with mild/minimal irritation. Biopsies for       histology were taken with a cold forceps for evaluation of celiac       disease.      The exam of the esophagus was otherwise normal.      Diffuse and patchy minimal inflammation characterized by congestion       (edema) and erythema was found in the gastric body. Biopsies were taken       with a cold forceps for histology from, the antrum and body, placed into       separate containers.      The cardia and gastric fundus were normal on retroflexion.      The examined duodenum was normal.      The stomach was normal.      poor pyloric tone Impression:           - Z-line regular.  Biopsied.                       - Gastritis. Biopsied.                       -  Normal examined duodenum.                       - note poor pyloric tone Recommendation:       - Discharge patient to home.                       - Use Protonix (pantoprazole) 40 mg PO BID for 2 weeks.                       - Then continue Protonix (pantoprazole) 40 mg PO daily                        daily.                       - Return to GI clinic in 5 weeks. Procedure Code(s):    --- Professional ---                       [phone], Esophagogastroduodenoscopy, flexible, transoral;                        with biopsy, single or multiple Diagnosis Code(s):    --- Professional ---                       K29.70, Gastritis, unspecified, without bleeding                       R13.10, Dysphagia, unspecified                       K21.9, Gastro-esophageal reflux disease without                        esophagitis CPT copyright 2018 American [employer] Association. All rights reserved. The codes documented in this report are preliminary and upon coder review may  be revised to meet current compliance requirements. Lollie Sails, MD 12/13/2018 8:06:18 AM This report has been signed electronically. Number of Addenda: 0 Note Initiated On: 12/13/2018 7:00 AM      [facility]

## 2018-12-13 NOTE — Anesthesia Postprocedure Evaluation (Signed)
Anesthesia Post Note  Patient: Jasmine Welch  Procedure(s) Performed: ESOPHAGOGASTRODUODENOSCOPY (EGD) (N/A )  Patient location during evaluation: Endoscopy Anesthesia Type: General Level of consciousness: awake and alert and oriented Pain management: pain level controlled Vital Signs Assessment: post-procedure vital signs reviewed and stable Respiratory status: spontaneous breathing, nonlabored ventilation and respiratory function stable Cardiovascular status: blood pressure returned to baseline and stable Postop Assessment: no signs of nausea or vomiting Anesthetic complications: no     Last Vitals:  Vitals:   12/13/18 0820 12/13/18 0830  BP: 109/70 120/68  Pulse: 60 63  Resp: 14 14  Temp:    SpO2: 100% 98%    Last Pain:  Vitals:   12/13/18 0800  TempSrc: Tympanic  PainSc:                  Galileo Colello

## 2018-12-16 LAB — SURGICAL PATHOLOGY

## 2019-04-11 ENCOUNTER — Ambulatory Visit
Admission: RE | Admit: 2019-04-11 | Discharge: 2019-04-11 | Disposition: A | Discharge status: Home / Self Care | Payer: Medicare Other | Source: Ambulatory Visit | Attending: Physical Medicine and Rehabilitation | Admitting: Physical Medicine and Rehabilitation

## 2019-04-11 ENCOUNTER — Other Ambulatory Visit: Payer: Self-pay

## 2019-04-11 ENCOUNTER — Other Ambulatory Visit: Payer: Self-pay | Admitting: Physical Medicine and Rehabilitation

## 2019-04-11 DIAGNOSIS — M5416 Radiculopathy, lumbar region: Secondary | ICD-10-CM | POA: Insufficient documentation

## 2019-04-23 IMAGING — RF DG ESOPHAGUS
9 of 11 series · 14 of 24 positions shown · non-contrast
Comparison: None.

CLINICAL DATA: Dysphagia, heartburn

EXAM:
ESOPHOGRAM / BARIUM SWALLOW / BARIUM TABLET STUDY
TECHNIQUE: Combined double contrast and single contrast examination performed
using effervescent crystals, thick barium liquid, and thin barium
liquid. The patient was observed with fluoroscopy swallowing a 13 mm
barium sulphate tablet.
FLUOROSCOPY TIME:  Fluoroscopy Time:  48 seconds
Radiation Exposure Index (if provided by the fluoroscopic device):
3.8 mGy
Number of Acquired Spot Images: 0

[Series 1: cp_standard · 0.25mm/px · 2 of 26 frames shown (1 of 9)]
[frame 4/26]
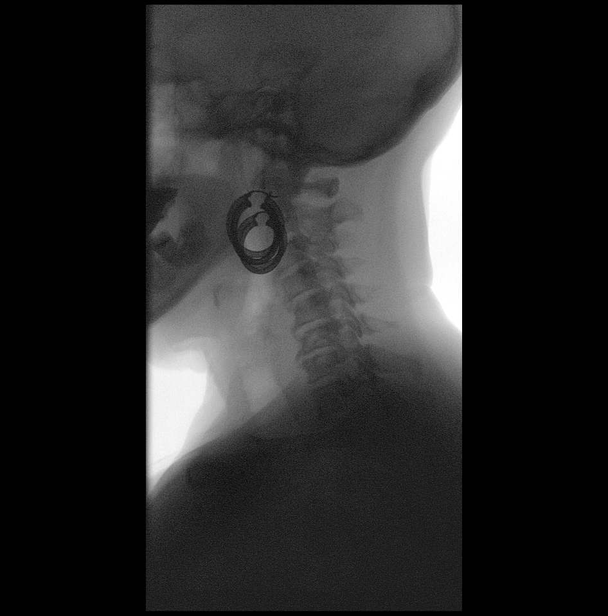
[frame 14/26]
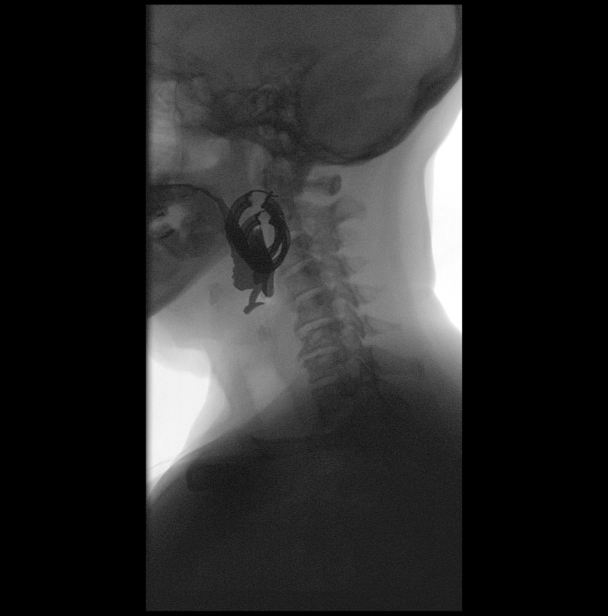

[Series 2: cp_standard · 0.25mm/px · 2 of 63 frames shown (2 of 9)]
[frame 29/63]
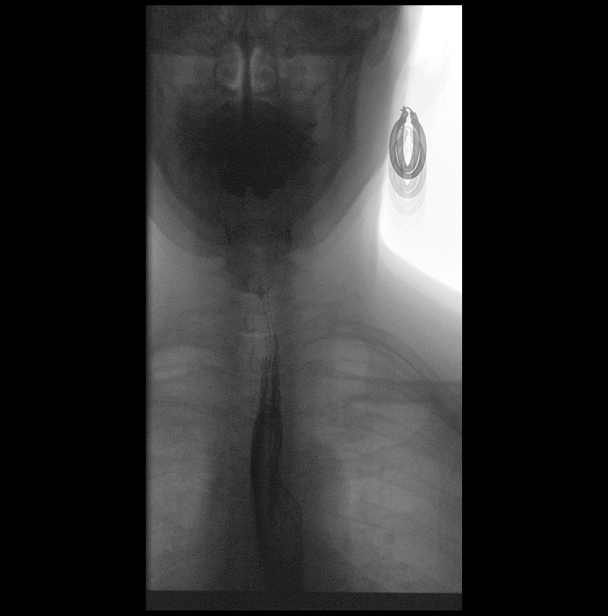
[frame 54/63]
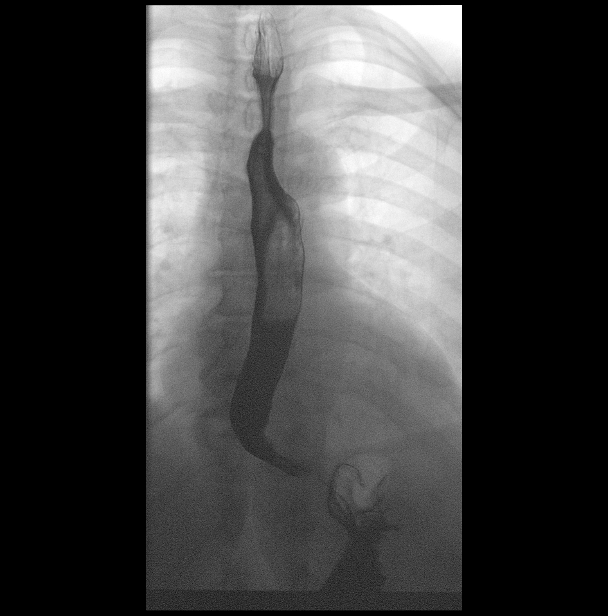

[Series 4: cp_standard · 0.26mm/px · 2 of 53 frames shown (3 of 9)]
[frame 7/53]
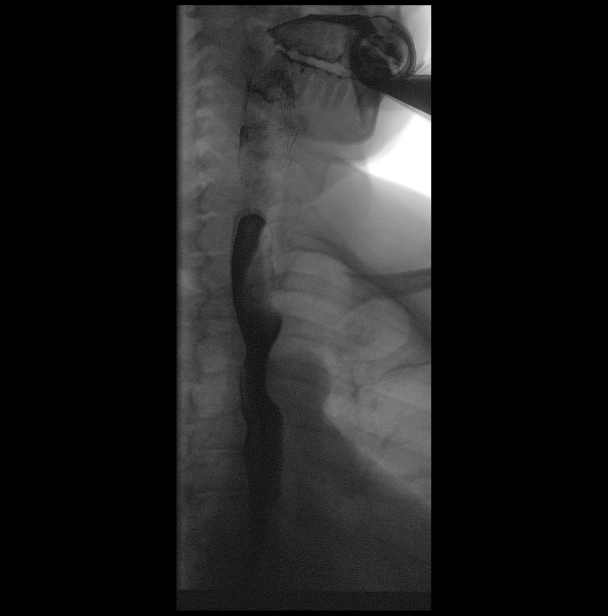
[frame 27/53]
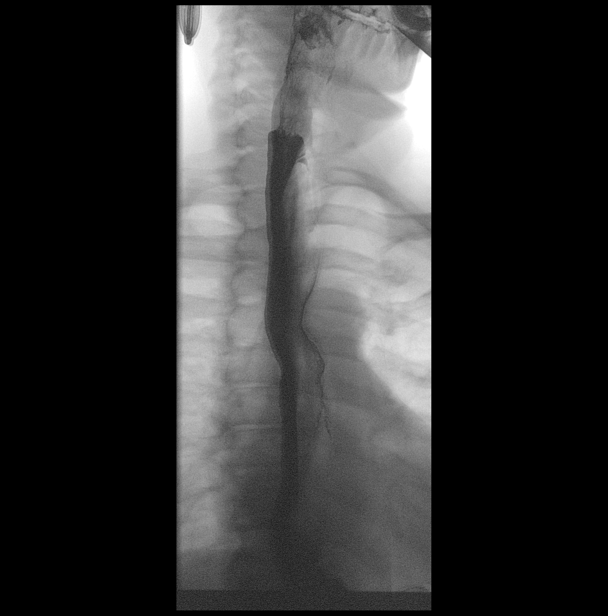

[Series 5: cp_standard · 0.26mm/px · 1 of 1 slices shown (4 of 9)]
[im 1/1]
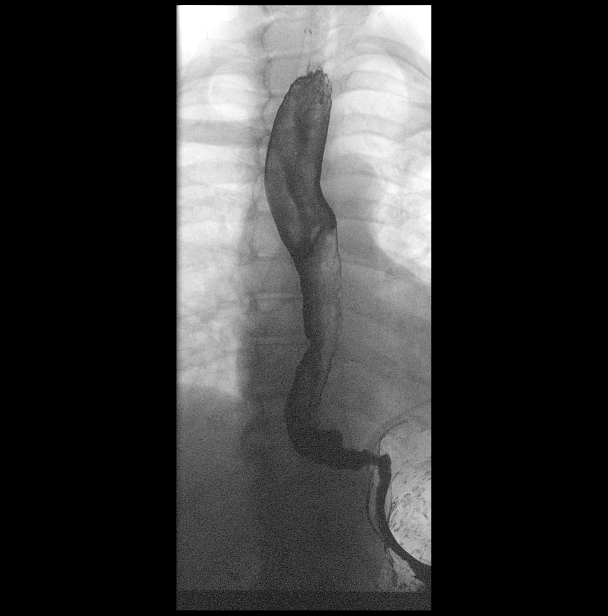

[Series 7: cp_standard · 0.26mm/px · 2 acquisitions, 2 frames shown (5 of 9)]
[im 2/2]
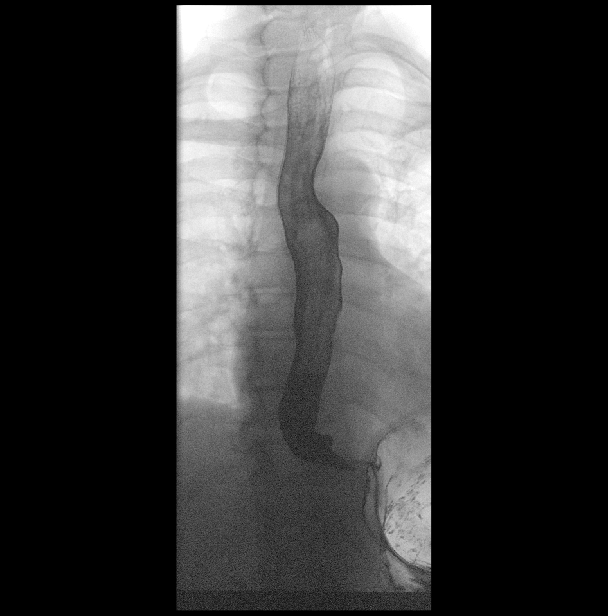
[im 2/2]
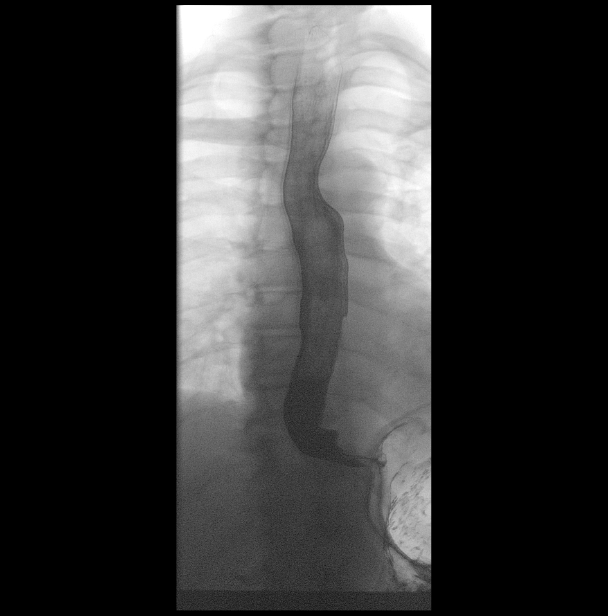

[Series 8: cp_standard · 0.26mm/px · 1 of 1 slices shown (6 of 9)]
[im 1/1]
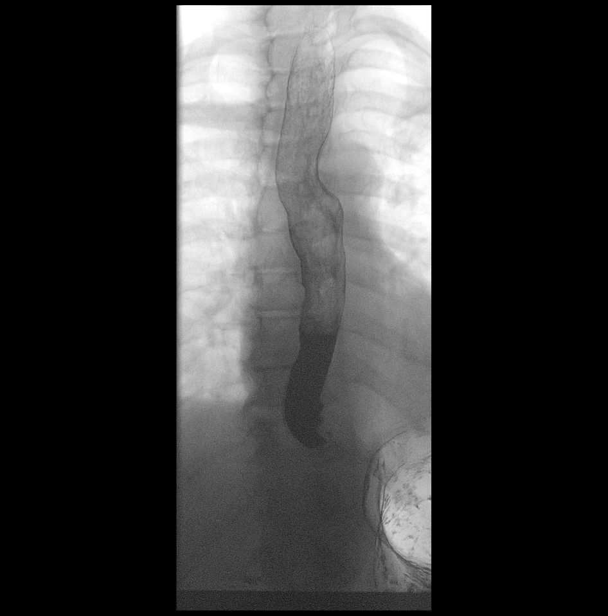

[Series 11: cp_standard · 0.28mm/px · 1 of 1 slices shown (7 of 9)]
[im 1/1]
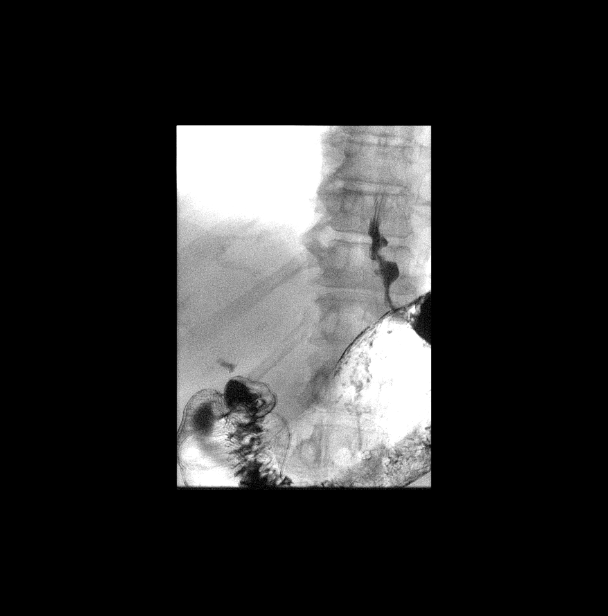

[Series 12: cp_standard · 0.28mm/px · 1 of 1 slices shown (8 of 9)]
[im 1/1]
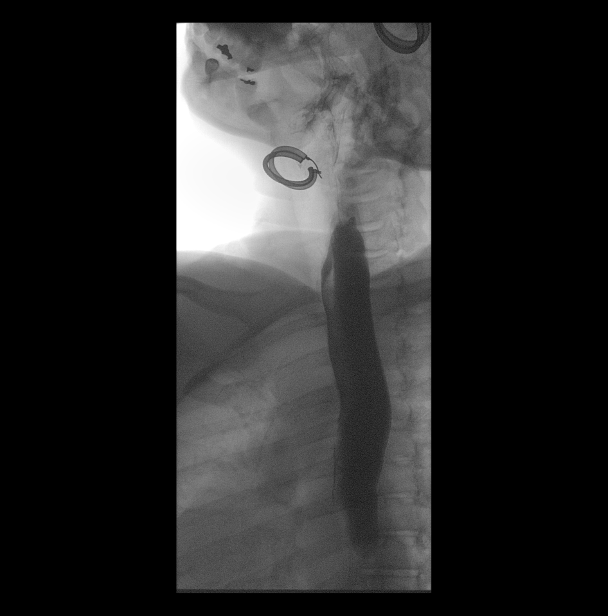

[Series 14: cp_standard · 0.28mm/px · 2 of 6 frames shown (9 of 9)]
[frame 4/6]
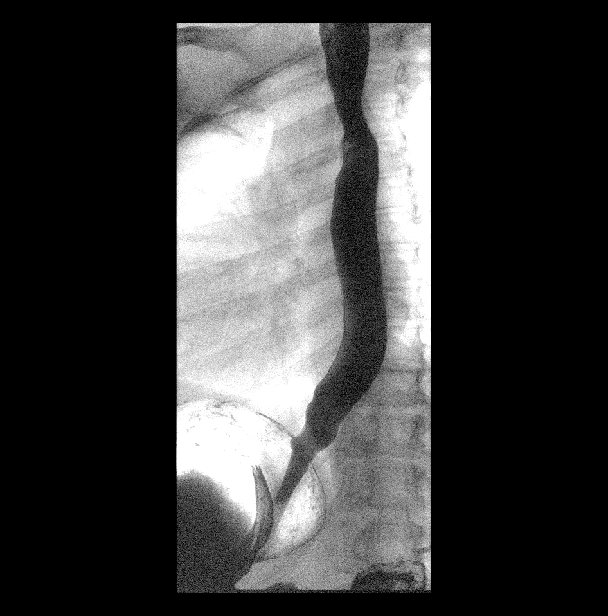
[frame 6/6]
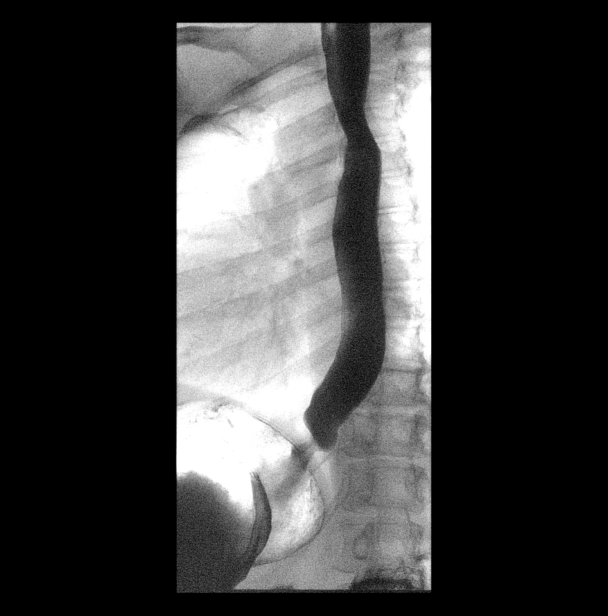

[14 of 24 positions shown; findings below may reference images not displayed]

FINDINGS: There was normal pharyngeal anatomy and motility. Contrast flowed
freely through the esophagus without evidence of stricture or mass.
There was normal esophageal mucosa without evidence of irregularity
or ulceration. Mild tertiary contractions of the distal third of the
esophagus intermittently. Mild gastroesophageal reflux. No definite
hiatal hernia was demonstrated.

At the end of the examination a 13 mm barium tablet was administered
which transited through the esophagus and esophagogastric junction
without delay.
IMPRESSION: 1. Mild gastroesophageal reflux.
2. Mild tertiary contractions of the distal third of the esophagus
as can be seen with presbyesophagus.

## 2019-09-15 ENCOUNTER — Other Ambulatory Visit: Payer: Self-pay | Admitting: Family Medicine

## 2019-09-15 DIAGNOSIS — Z1231 Encounter for screening mammogram for malignant neoplasm of breast: Secondary | ICD-10-CM

## 2019-12-03 ENCOUNTER — Ambulatory Visit
Admission: RE | Admit: 2019-12-03 | Discharge: 2019-12-03 | Disposition: A | Discharge status: Home / Self Care | Payer: Medicare PPO | Source: Ambulatory Visit | Attending: Family Medicine | Admitting: Family Medicine

## 2019-12-03 DIAGNOSIS — Z1231 Encounter for screening mammogram for malignant neoplasm of breast: Secondary | ICD-10-CM | POA: Insufficient documentation

## 2020-10-29 ENCOUNTER — Other Ambulatory Visit: Payer: Self-pay | Admitting: Family Medicine

## 2020-10-29 DIAGNOSIS — Z1231 Encounter for screening mammogram for malignant neoplasm of breast: Secondary | ICD-10-CM

## 2020-12-03 ENCOUNTER — Other Ambulatory Visit: Payer: Self-pay

## 2020-12-03 ENCOUNTER — Ambulatory Visit
Admission: RE | Admit: 2020-12-03 | Discharge: 2020-12-03 | Disposition: A | Discharge status: Home / Self Care | Payer: Medicare PPO | Source: Ambulatory Visit | Attending: Family Medicine | Admitting: Family Medicine

## 2020-12-03 DIAGNOSIS — Z1231 Encounter for screening mammogram for malignant neoplasm of breast: Secondary | ICD-10-CM

## 2021-11-29 ENCOUNTER — Other Ambulatory Visit: Payer: Self-pay | Admitting: Family Medicine

## 2021-11-29 DIAGNOSIS — Z1231 Encounter for screening mammogram for malignant neoplasm of breast: Secondary | ICD-10-CM

## 2021-12-23 ENCOUNTER — Other Ambulatory Visit: Payer: Self-pay

## 2021-12-23 ENCOUNTER — Ambulatory Visit
Admission: RE | Admit: 2021-12-23 | Discharge: 2021-12-23 | Disposition: A | Discharge status: Home / Self Care | Payer: Medicare PPO | Source: Ambulatory Visit | Attending: Family Medicine | Admitting: Family Medicine

## 2021-12-23 DIAGNOSIS — Z1231 Encounter for screening mammogram for malignant neoplasm of breast: Secondary | ICD-10-CM | POA: Insufficient documentation

## 2022-01-04 ENCOUNTER — Encounter: Payer: Self-pay | Admitting: Physical Therapy

## 2022-01-04 ENCOUNTER — Other Ambulatory Visit: Payer: Self-pay

## 2022-01-04 ENCOUNTER — Ambulatory Visit: Payer: Medicare PPO | Attending: Obstetrics and Gynecology | Admitting: Physical Therapy

## 2022-01-04 DIAGNOSIS — M545 Low back pain, unspecified: Secondary | ICD-10-CM | POA: Insufficient documentation

## 2022-01-04 DIAGNOSIS — R278 Other lack of coordination: Secondary | ICD-10-CM | POA: Insufficient documentation

## 2022-01-04 DIAGNOSIS — M2170 Unequal limb length (acquired), unspecified site: Secondary | ICD-10-CM | POA: Insufficient documentation

## 2022-01-04 DIAGNOSIS — G8929 Other chronic pain: Secondary | ICD-10-CM | POA: Diagnosis present

## 2022-01-04 DIAGNOSIS — M25572 Pain in left ankle and joints of left foot: Secondary | ICD-10-CM | POA: Insufficient documentation

## 2022-01-04 DIAGNOSIS — M533 Sacrococcygeal disorders, not elsewhere classified: Secondary | ICD-10-CM | POA: Insufficient documentation

## 2022-01-04 DIAGNOSIS — R2689 Other abnormalities of gait and mobility: Secondary | ICD-10-CM | POA: Insufficient documentation

## 2022-01-04 NOTE — Patient Instructions (Signed)
°  Proper body mechanics with getting out of a chair to decrease strain  on back &pelvic floor   Avoid holding your breath when Getting out of the chair:  Scoot to front part of chair chair Heels behind knees, feet are hip width apart, nose over toes  Inhale like you are smelling roses Exhale to stand   __   Avoid straining pelvic floor, abdominal muscles , spine  Use log rolling technique instead of getting out of bed with your neck or the sit-up     Log rolling into and out of bed   Log rolling into and out of bed If getting out of bed on R side, Bent knees, scoot hips/ shoulder to L  Raise R arm completely overhead, rolling onto armpit  Then lower bent knees to bed to get into complete side lying position  Then drop legs off bed, and push up onto R elbow/forearm, and use L hand to push onto the bed  __  Wear shoe lift in R shoe,  Remove it if it causes more pain

## 2022-01-05 NOTE — Therapy (Signed)
[place] MAIN [facility] SERVICES [address] [place], [place], [address] Phone: [phone]   Fax:  [phone]  Physical Therapy Evaluation  Patient Details  Name: Jasmine Welch MRN: [id number] Date of Birth: 1946/11/07 Referring Provider (PT): Leafy Ro MD   Encounter Date: 01/04/2022   PT End of Session - 01/05/22 0834     Visit Number 1    Number of Visits 10    Date for PT Re-Evaluation 03/16/22    PT Start Time 0900    PT Stop Time 1000    PT Time Calculation (min) 60 min    Activity Tolerance Patient tolerated treatment well;No increased pain    Behavior During Therapy WFL for tasks assessed/performed             Past Medical History:  Diagnosis Date   Anemia    Diabetes mellitus without complication (HCC)    Fatty liver    Gastric ulcer due to Helicobacter pylori    GERD (gastroesophageal reflux disease)    History of esophageal stricture    Hyperlipidemia    Hyperplastic colon polyp    Hypertension    Right ankle pain     Past Surgical History:  Procedure Laterality Date   ABDOMINAL HYSTERECTOMY     carpel tunnel     CHOLECYSTECTOMY     COLONOSCOPY N/A 05/25/2015   Procedure: COLONOSCOPY;  Surgeon: Lollie Sails, MD;  Location: [facility] ENDOSCOPY;  Service: Endoscopy;  Laterality: N/A;   ESOPHAGOGASTRODUODENOSCOPY N/A 12/13/2018   Procedure: ESOPHAGOGASTRODUODENOSCOPY (EGD);  Surgeon: Lollie Sails, MD;  Location: [facility] ENDOSCOPY;  Service: Endoscopy;  Laterality: N/A;   mamoplastic reduction     REDUCTION MAMMAPLASTY Bilateral 1973   TONSILLECTOMY     TUBAL LIGATION      There were no vitals filed for this visit.    Subjective Assessment - 01/04/22 0918     Subjective 1) Pt felt pressure on her rectum with spasms for the past 8-10 years. Pf feels the urge to go the bathroom to have a BM . Pt goes to sit on the commade and all of sudden, she feels hot and breaks out with sweat and felt weak. This  occurs randomly , 4 episodes within 1 year. Pt stretches on her bed but she still feels the urge to have a toilet. Pt has strain to have a BM but still nothing comes out. Frequency of BM occurs daily or twice a day. Bristol Stool Type 2, 6.  Daily fluid intake: 40 oz water , 24 oz of tea, pt loves ice and drink all fluids with ice.    2) Pt noticed a ball in her vagina. Her urine stream is coming out spread across. Denied urge incontinence nor SUI.  Pt had a fall onto her tailhone March 2022 and 2021 in her yard.    3) CLBP for 18 years, located in the low back without radiating pain. Worst with working in the yard, lifting,  Pain level 6-7/10 at its worst, oon average 3-4 /10.  Pt has had pain with sexual intercourse where she had an episiotomy. Physical activity: cleaning, organizing, cooking, yard work without regular walking  4) acute L ankle pain that started 2 days ago.   Pt had gout from 12/18/21. Pt contacted her PCP about her gout and took Tramdaol and Prednisone which helped with the gout at the big toe. The ankle pain causing her to limp.     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 to use the bathroom only 2x ( morning and the afternoon)., Hx of UTI    Patient Stated Goals see improvement with pelvic organs in proper position and not see it when lifting her leg                [facility] PT Assessment - 01/05/22 0833       Assessment   Medical Diagnosis Cystocole    Referring Provider (PT) Leafy Ro MD      Precautions   Precautions None      Restrictions   Weight Bearing Restrictions No      Balance Screen   Has the patient fallen in the past 6 months No      [place] residence    Living Arrangements Spouse/significant other    Type of [place] Two level      Prior Function   Level of Independence  Independent      Observation/Other Assessments   Scoliosis L shoulder lowered, L iliac crest higher '      Palpation   SI assessment  finching tenderness at coccyx w/ palpation, tightness / posterior alignment of R SIJ, supine : ASIS levelled, L malleoli lower,                        Objective measurements completed on examination: See above findings.     Pelvic Floor Special Questions - 01/05/22 0852     Diastasis Recti neg              [facility] Adult PT Treatment/Exercise - 01/05/22 7846       Ambulation/Gait   Gait Comments pre Tx: 0.9 m/s without shoe lift, Post Tx: 1.04 m/s with shoe lift in R      Therapeutic Activites    Other Therapeutic Activities explained POC, anatomy/ physiology, co-created  goals      Neuro Re-ed    Neuro Re-ed Details  cued for body  mechanics to minimzie straining pelvic floor      Manual Therapy   Manual therapy comments Rotational mob, PA mob Grade II at R SIJ, superior glide at lateral borders of coccyx ,                          PT Long Term Goals - 01/05/22 0839       PT LONG TERM GOAL #1   Title Pt will improve her FOTO lumbar score by > 5 pts in order to imrpove QOL and ADLs    Baseline 56 pts    Time 10    Period Weeks    Status New    Target Date 03/16/22      PT LONG TERM GOAL #2   Title Pt will lower her score on FOTO prolapse from 33pts , Bowel Problem 25 pts by more than 5 pts in order to restore pelvic function    Time 10    Period Weeks    Status New    Target Date 03/16/22      PT LONG TERM GOAL #3   Title Pt will demo equal pelvic girdle and shoulder height acriss 2 visits with shoe lift in R shoe in order to minimize coccyx pain and progress to deep core HEP    Time 2  Period Weeks    Status New    Target Date 01/19/22      PT LONG TERM GOAL #4   Title Pt will demo no limp in her gait and report > 50 % pain at L ankle in order to minimzie risk of falls    Time 6    Period  Weeks    Status New    Target Date 02/16/22      PT LONG TERM GOAL #5   Title Pt will demo proper technique for deep core in order to improve IAP system for less straining with  bowel movements    Baseline dyscoordination    Time 6    Period Weeks    Status New    Target Date 02/16/22      Additional Long Term Goals   Additional Long Term Goals Yes      PT LONG TERM GOAL #6   Title Pt will demo proper body  mechanics in her ADLs and yardwork in order to minimzie worsening of prolapse    Time 4    Status New    Target Date 02/02/22      PT LONG TERM GOAL #7   Title Pt will report no longer needing to experiencing rectal spasms, straining 50% less , less difficulty with elimination, and having stool type 4 50% of the time instead of Type 2 and 6 100% of the time    Baseline she still feels the urge to have a toilet. Pt has strain to have a BM but still nothing comes out. Frequency of BM occurs daily or twice a day. Bristol Stool Type 2, 6    Time 10    Period Weeks    Status New    Target Date 03/16/22                    Plan - 01/05/22 0835     Clinical Impression Statement  Pt is a  76  yo  who presents with rectal spasms, difficulty with eliminating bowels, prolapse, CLBP, and acute L ankle pain which impact her QOL and ADLs.  Pt's musculoskeletal assessment revealed flinching tenderness at coccyx, hypomobile SIJ,  uneven pelvic girdle and shoulder height, leg length discrepancy, gait deviations,  dyscoordination and strength of pelvic floor mm, and  poor body mechanics which places strain on the abdominal/pelvic floor mm.   These are deficits that indicate an ineffective intraabdominal pressure system associated with increased risk for pt's Sx. 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 Regional interdependent  approaches will yield greater benefits in pt's POC due to leg length difference and acute L ankle pain.   Pt was provided education on etiology of Sx with anatomy, physiology explanation with images along with the benefits of customized pelvic PT Tx based on pt's medical conditions and musculoskeletal deficits.  Explained the physiology of deep core mm coordination and roles of pelvic floor function in urination, defecation, sexual function, and postural control with deep core mm system.    Following Tx today which pt tolerated without complaints, pt demo'd equal alignment of pelvic girdle and improved gait with shoe lift in R shoe. Pt also demo'd IND with proper mechanics to minimize straining pelvic floor. Plan to address body mechanics with ADLs and gardening to minimize CLBP and fall risks and worsening of prolapse.  Pt benefits from skilled PT.     Personal Factors and Comorbidities Comorbidity 3+;Profession    Examination-Activity Limitations Stand;Toileting;Continence;Locomotion Level;Carry;Squat;Lift    Stability/Clinical Decision Making Evolving/Moderate complexity    Clinical Decision Making Moderate    Rehab Potential Good    PT Frequency 1x / week    PT Duration Other (comment)   10   PT Treatment/Interventions Canalith Repostioning;Therapeutic exercise;Balance training;Therapeutic activities;Patient/family education;Functional mobility training;Stair training;Neuromuscular re-education;Moist Heat;Traction;Scar mobilization;Manual techniques;Energy conservation;Splinting;Joint Manipulations;Spinal Manipulations;Taping;Manual lymph drainage;ADLs/Self Care Home Management;Gait training    Consulted and Agree with Plan of Care Patient             Patient will benefit from skilled therapeutic intervention in order to improve the following deficits and impairments:  Decreased balance, Decreased activity tolerance, Decreased skin integrity, Decreased mobility, Decreased strength,  Decreased scar mobility, Decreased endurance, Decreased coordination, Decreased range of motion, Increased muscle spasms, Hypomobility, Improper body mechanics, Impaired sensation, Difficulty walking  Visit Diagnosis: Sacrococcygeal disorders, not elsewhere classified  Unequal leg length  Other lack of coordination  Other abnormalities of gait and mobility  Acute left ankle pain  Chronic bilateral low back pain without sciatica     Problem List There are no problems to display for this patient.   Jerl Mina, PT 01/05/2022, 9:00 AM  [place] [facility]IN [facility] SERVICES [address] [place], [place], [address] Phone: [phone]   Fax:  [phone]  Name: Jasmine Welch MRN: [id number] Date of Birth: [birth year]

## 2022-01-11 ENCOUNTER — Ambulatory Visit: Payer: Medicare PPO | Admitting: Physical Therapy

## 2022-01-11 ENCOUNTER — Other Ambulatory Visit: Payer: Self-pay

## 2022-01-11 DIAGNOSIS — M533 Sacrococcygeal disorders, not elsewhere classified: Secondary | ICD-10-CM

## 2022-01-11 DIAGNOSIS — M545 Low back pain, unspecified: Secondary | ICD-10-CM

## 2022-01-11 DIAGNOSIS — M25572 Pain in left ankle and joints of left foot: Secondary | ICD-10-CM

## 2022-01-11 DIAGNOSIS — R278 Other lack of coordination: Secondary | ICD-10-CM

## 2022-01-11 DIAGNOSIS — M2170 Unequal limb length (acquired), unspecified site: Secondary | ICD-10-CM

## 2022-01-11 DIAGNOSIS — R2689 Other abnormalities of gait and mobility: Secondary | ICD-10-CM

## 2022-01-11 NOTE — Patient Instructions (Signed)
° °  Clam Shell 45 Degrees  Lying with hips and knees bent 45, one pillow between knees and ankles. Heel together, toes apart like ballerina,  Lift knee with exhale while pressing heels together. Be sure pelvis does not roll backward. Do not arch back. Do 20 times, each leg, 2 times per day.    Complimentary stretch: Figure-4 stretch   3 breaths     __  If you lie on your back, remember to dig elbows and feet down, lower ribs first  then the pelvis to length back

## 2022-01-12 NOTE — Therapy (Signed)
[place] MAIN [facility] SERVICES [address] [place], [place], [address] Phone: [phone]   Fax:  [phone]  Physical Therapy Treatment  Patient Details  Name: Jasmine Welch MRN: [id number] Date of Birth: 1945-12-31 Referring Provider (PT): Leafy Ro MD   Encounter Date: 01/11/2022   PT End of Session - 01/11/22 1009     Visit Number 3    Number of Visits 10    Date for PT Re-Evaluation 03/16/22    PT Start Time 0904    PT Stop Time 1003    PT Time Calculation (min) 59 min    Activity Tolerance Patient tolerated treatment well;No increased pain    Behavior During Therapy WFL for tasks assessed/performed             Past Medical History:  Diagnosis Date   Anemia    Diabetes mellitus without complication (HCC)    Fatty liver    Gastric ulcer due to Helicobacter pylori    GERD (gastroesophageal reflux disease)    History of esophageal stricture    Hyperlipidemia    Hyperplastic colon polyp    Hypertension    Right ankle pain     Past Surgical History:  Procedure Laterality Date   ABDOMINAL HYSTERECTOMY     carpel tunnel     CHOLECYSTECTOMY     COLONOSCOPY N/A 05/25/2015   Procedure: COLONOSCOPY;  Surgeon: Lollie Sails, MD;  Location: [facility] ENDOSCOPY;  Service: Endoscopy;  Laterality: N/A;   ESOPHAGOGASTRODUODENOSCOPY N/A 12/13/2018   Procedure: ESOPHAGOGASTRODUODENOSCOPY (EGD);  Surgeon: Lollie Sails, MD;  Location: [facility] ENDOSCOPY;  Service: Endoscopy;  Laterality: N/A;   mamoplastic reduction     REDUCTION MAMMAPLASTY Bilateral 1973   TONSILLECTOMY     TUBAL LIGATION      There were no vitals filed for this visit.   Subjective Assessment - 01/11/22 0912     Subjective Pt reported shoe lift felt good. Pt had a injection on her R hip and pain has gone done. 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 the afternoon)., Hx of UTI    Patient Stated Goals see improvement with pelvic organs in proper position and not see it when lifting her leg                [facility] PT Assessment - 01/12/22 0950       Palpation   SI assessment  hypomobile SIJ B    Palpation comment tightness at R hamstring, foot, L glut mmed/ mini , SIJ                           [facility] Adult PT Treatment/Exercise - 01/12/22 0950       Ambulation/Gait   Gait Comments 1.13 m/s with trunk lean R      Therapeutic Activites    Other Therapeutic Activities cued for adjusting back to minimize LBP when in supine, educated use of 2 pillows top one in vertical position on top of horizontal one to be semi reclined due to her GERD and not to have two pillows under neck      Neuro Re-ed    Neuro Re-ed Details  cued for clams and knee to chest and figure-4  to promote hip and SIJ mobility      Modalities   Modalities Moist Heat      Moist Heat Therapy   Moist Heat Location --   sacrum ( unbilled)     Manual Therapy   Manual therapy comments STM/MWM , rotation mob at B SIJ to promote mobility and at R leg/ ankle./ and foot                          PT Long Term Goals - 01/05/22 0839       PT LONG TERM GOAL #1   Title Pt will improve her FOTO lumbar score by > 5 pts in order to imrpove QOL and ADLs    Baseline 56 pts    Time 10    Period Weeks    Status New    Target Date 03/16/22      PT LONG TERM GOAL #2   Title Pt will lower her score on FOTO prolapse from 33pts , Bowel Problem 25 pts by more than 5 pts in order to restore pelvic function    Time 10    Period Weeks    Status New    Target Date 03/16/22      PT LONG TERM GOAL #3   Title Pt will demo equal pelvic girdle and shoulder height acriss 2 visits with shoe lift in  R shoe in order to minimize coccyx pain and progress to deep core HEP    Time 2    Period Weeks    Status New    Target Date 01/19/22      PT LONG TERM GOAL #4   Title Pt will demo no limp in her gait and report > 50 % pain at L ankle in order to minimzie risk of falls    Time 6    Period Weeks    Status New    Target Date 02/16/22      PT LONG TERM GOAL #5   Title Pt will demo proper technique for deep core in order to improve IAP system for less straining with  bowel movements    Baseline dyscoordination    Time 6    Period Weeks    Status New    Target Date 02/16/22      Additional Long Term Goals   Additional Long Term Goals Yes      PT LONG TERM GOAL #6   Title Pt will demo proper body  mechanics in her ADLs and yardwork in order to minimzie worsening of prolapse    Time 4    Status New    Target Date 02/02/22      PT LONG TERM GOAL #7   Title Pt will report no longer needing to experiencing rectal spasms, straining 50% less , less difficulty with elimination, and having stool type 4 50% of the time instead of Type 2 and 6 100% of the time    Baseline she still feels the urge to have a toilet. Pt has strain to have a BM but still nothing comes out. Frequency of BM occurs daily or twice a day. [place] Stool Type 2, 6    Time 10    Period Weeks    Status New    Target Date 03/16/22  Plan - 01/11/22 1009     Clinical Impression Statement Pt demonstrated levelled pelvic girdle with shoe lift adjusting for her leg length difference. Pt had no issues with shoe lift. Pt demo'd trunk lean with gait which was due to weaker hip abduction strength on R . Pt demo'd correct form with clam shell after training. Pt also required manual Tx to promote mobility at R SIJ / lower kinetic chain which pt tolerated without pain. Continue to address arthrokinematics and progress to deep core training next session. Pt continues to benefit from skilled PT   Personal  Factors and Comorbidities Comorbidity 3+;Profession    Examination-Activity Limitations Stand;Toileting;Continence;Locomotion Level;Carry;Squat;Lift    Stability/Clinical Decision Making Evolving/Moderate complexity    Rehab Potential Good    PT Frequency 1x / week    PT Duration Other (comment)   10   PT Treatment/Interventions Canalith Repostioning;Therapeutic exercise;Balance training;Therapeutic activities;Patient/family education;Functional mobility training;Stair training;Neuromuscular re-education;Moist Heat;Traction;Scar mobilization;Manual techniques;Energy conservation;Splinting;Joint Manipulations;Spinal Manipulations;Taping;Manual lymph drainage;ADLs/Self Care Home Management;Gait training    Consulted and Agree with Plan of Care Patient             Patient will benefit from skilled therapeutic intervention in order to improve the following deficits and impairments:  Decreased balance, Decreased activity tolerance, Decreased skin integrity, Decreased mobility, Decreased strength, Decreased scar mobility, Decreased endurance, Decreased coordination, Decreased range of motion, Increased muscle spasms, Hypomobility, Improper body mechanics, Impaired sensation, Difficulty walking  Visit Diagnosis: Sacrococcygeal disorders, not elsewhere classified  Unequal leg length  Other lack of coordination  Other abnormalities of gait and mobility  Acute left ankle pain  Chronic bilateral low back pain without sciatica     Problem List There are no problems to display for this patient.   Jerl Mina, PT 01/12/2022, 9:51 AM  [place] MAIN [facility] SERVICES [address] [place], [place], [address] Phone: [phone]   Fax:  [phone]  Name: MAYARI MATUS MRN: [id number] Date of Birth: 26-Dec-1945

## 2022-01-18 ENCOUNTER — Ambulatory Visit: Payer: Medicare PPO | Admitting: Physical Therapy

## 2022-01-18 ENCOUNTER — Other Ambulatory Visit: Payer: Self-pay

## 2022-01-18 DIAGNOSIS — R278 Other lack of coordination: Secondary | ICD-10-CM

## 2022-01-18 DIAGNOSIS — M25572 Pain in left ankle and joints of left foot: Secondary | ICD-10-CM

## 2022-01-18 DIAGNOSIS — G8929 Other chronic pain: Secondary | ICD-10-CM

## 2022-01-18 DIAGNOSIS — M533 Sacrococcygeal disorders, not elsewhere classified: Secondary | ICD-10-CM | POA: Diagnosis not present

## 2022-01-18 DIAGNOSIS — R2689 Other abnormalities of gait and mobility: Secondary | ICD-10-CM

## 2022-01-18 DIAGNOSIS — M2170 Unequal limb length (acquired), unspecified site: Secondary | ICD-10-CM

## 2022-01-18 NOTE — Therapy (Signed)
[place] MAIN [facility] SERVICES [address] [place], [place], [address] Phone: [phone]   Fax:  [phone]  Physical Therapy Treatment  Patient Details  Name: Jasmine Welch MRN: [id number] Date of Birth: 1946-08-07 Referring Provider (PT): Leafy Ro MD   Encounter Date: 01/18/2022   PT End of Session - 01/18/22 0915     Visit Number 4    Number of Visits 10    Date for PT Re-Evaluation 03/16/22    PT Start Time 0905    PT Stop Time 1000    PT Time Calculation (min) 55 min    Activity Tolerance Patient tolerated treatment well;No increased pain    Behavior During Therapy WFL for tasks assessed/performed             Past Medical History:  Diagnosis Date   Anemia    Diabetes mellitus without complication (HCC)    Fatty liver    Gastric ulcer due to Helicobacter pylori    GERD (gastroesophageal reflux disease)    History of esophageal stricture    Hyperlipidemia    Hyperplastic colon polyp    Hypertension    Right ankle pain     Past Surgical History:  Procedure Laterality Date   ABDOMINAL HYSTERECTOMY     carpel tunnel     CHOLECYSTECTOMY     COLONOSCOPY N/A 05/25/2015   Procedure: COLONOSCOPY;  Surgeon: Lollie Sails, MD;  Location: [facility] ENDOSCOPY;  Service: Endoscopy;  Laterality: N/A;   ESOPHAGOGASTRODUODENOSCOPY N/A 12/13/2018   Procedure: ESOPHAGOGASTRODUODENOSCOPY (EGD);  Surgeon: Lollie Sails, MD;  Location: [facility] ENDOSCOPY;  Service: Endoscopy;  Laterality: N/A;   mamoplastic reduction     REDUCTION MAMMAPLASTY Bilateral 1973   TONSILLECTOMY     TUBAL LIGATION      There were no vitals filed for this visit.   Subjective Assessment - 01/18/22 0909     Subjective Pt is still not sleeping well. Pt has OSA but she did not have to wear a CPAP after she removed her uvula and tonsils in the 1980s. Shoe lift has helped alot.  L acute ankle pain has resolved. Her legs dont feel as heavy after standing for  a while washing food.    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      Other:   Other/ Comments simulated gardening work, t/f with stool, demo'd excessive repeated downward movements ,      AROM   Overall AROM Comments R rotation limited compared to L                           [facility] Adult PT Treatment/Exercise - 01/18/22 1046       Therapeutic Activites    Other Therapeutic Activities explained minimizing downward forces with grdening t/f , utilize crawling and downward facing dog t/f  Neuro Re-ed    Neuro Re-ed Details  cued for sequence and alignment and use aof different stool to promote more verstaility of positions and less downward forces onto pelvic floor/ minimizing prolapse                          PT Long Term Goals - 01/05/22 0839       PT LONG TERM GOAL #1   Title Pt will improve her FOTO lumbar score by > 5 pts in order to imrpove QOL and ADLs    Baseline 56 pts    Time 10    Period Weeks    Status New    Target Date 03/16/22      PT LONG TERM GOAL #2   Title Pt will lower her score on FOTO prolapse from 33pts , Bowel Problem 25 pts by more than 5 pts in order to restore pelvic function    Time 10    Period Weeks    Status New    Target Date 03/16/22      PT LONG TERM GOAL #3   Title Pt will demo equal pelvic girdle and shoulder height acriss 2 visits with shoe lift in R shoe in order to minimize coccyx pain and progress to deep core HEP    Time 2     Period Weeks    Status New    Target Date 01/19/22      PT LONG TERM GOAL #4   Title Pt will demo no limp in her gait and report > 50 % pain at L ankle in order to minimzie risk of falls    Time 6    Period Weeks    Status New    Target Date 02/16/22      PT LONG TERM GOAL #5   Title Pt will demo proper technique for deep core in order to improve IAP system for less straining with  bowel movements    Baseline dyscoordination    Time 6    Period Weeks    Status New    Target Date 02/16/22      Additional Long Term Goals   Additional Long Term Goals Yes      PT LONG TERM GOAL #6   Title Pt will demo proper body  mechanics in her ADLs and yardwork in order to minimzie worsening of prolapse    Time 4    Status New    Target Date 02/02/22      PT LONG TERM GOAL #7   Title Pt will report no longer needing to experiencing rectal spasms, straining 50% less , less difficulty with elimination, and having stool type 4 50% of the time instead of Type 2 and 6 100% of the time    Baseline she still feels the urge to have a toilet. Pt has strain to have a BM but still nothing comes out. Frequency of BM occurs daily or twice a day. [place] Stool Type 2, 6    Time 10    Period Weeks    Status New    Target Date 03/16/22                   Plan - 01/18/22 0919     Clinical Impression Statement Shoe lift has helped alot.  L acute ankle pain has resolved. Her legs dont feel as heavy after standing for a while washing food.   Focused on  body mechanics with gardening which is an activity plans to do in upcoming days. Cued for proper squat in stand/floor t/f.  Cued for sequence and alignment and use of different stool to promote more verstaility of positions and less downward forces onto pelvic floor/ minimizing prolapse. Videos were recorded on pt's phone with pt's consent. Pt practiced multiple trials. Pt demo'd correct form post training. Plan to address L lumbar area which shows  posterior rotation which limits R trunk rotation and L half kneeling. Pt continues to benefit from skilled PT.   Personal Factors and Comorbidities Comorbidity 3+;Profession    Examination-Activity Limitations Stand;Toileting;Continence;Locomotion Level;Carry;Squat;Lift    Stability/Clinical Decision Making Evolving/Moderate complexity    Rehab Potential Good    PT Frequency 1x / week    PT Duration Other (comment)   10   PT Treatment/Interventions Canalith Repostioning;Therapeutic exercise;Balance training;Therapeutic activities;Patient/family education;Functional mobility training;Stair training;Neuromuscular re-education;Moist Heat;Traction;Scar mobilization;Manual techniques;Energy conservation;Splinting;Joint Manipulations;Spinal Manipulations;Taping;Manual lymph drainage;ADLs/Self Care Home Management;Gait training    Consulted and Agree with Plan of Care Patient             Patient will benefit from skilled therapeutic intervention in order to improve the following deficits and impairments:  Decreased balance, Decreased activity tolerance, Decreased skin integrity, Decreased mobility, Decreased strength, Decreased scar mobility, Decreased endurance, Decreased coordination, Decreased range of motion, Increased muscle spasms, Hypomobility, Improper body mechanics, Impaired sensation, Difficulty walking  Visit Diagnosis: Sacrococcygeal disorders, not elsewhere classified  Unequal leg length  Other lack of coordination  Other abnormalities of gait and mobility  Acute left ankle pain  Chronic bilateral low back pain without sciatica     Problem List There are no problems to display for this patient.   Jerl Mina, PT 01/18/2022, 10:48 AM  [place] MAIN [facility] SERVICES [address] [place], [place], [address] Phone: [phone]   Fax:  [phone]  Name: Jasmine Welch MRN: [id number] Date of Birth: 1946/11/05

## 2022-01-18 NOTE — Patient Instructions (Addendum)
Referral for sleep study . Please call Dr. Lovie Macadamia   ___      Transition from standing to floor :  stand to floor transfer :      _ slow     _ mini squat      _ crawl down with one hand on thigh      _downward dog  - >  shoulders down and back-  walk the dog ( knee bents to lengthe hamstrings)      Floor to stand :   downward dog   Feet are wider than hips, crawl hands back, butt is back, knees behind toes -> squat  Hands at waist , elbows back, chest lifts

## 2022-01-25 ENCOUNTER — Ambulatory Visit: Payer: Medicare PPO | Attending: Obstetrics and Gynecology | Admitting: Physical Therapy

## 2022-01-25 ENCOUNTER — Other Ambulatory Visit: Payer: Self-pay

## 2022-01-25 DIAGNOSIS — R2689 Other abnormalities of gait and mobility: Secondary | ICD-10-CM | POA: Diagnosis present

## 2022-01-25 DIAGNOSIS — M2170 Unequal limb length (acquired), unspecified site: Secondary | ICD-10-CM | POA: Insufficient documentation

## 2022-01-25 DIAGNOSIS — M545 Low back pain, unspecified: Secondary | ICD-10-CM | POA: Diagnosis present

## 2022-01-25 DIAGNOSIS — G8929 Other chronic pain: Secondary | ICD-10-CM | POA: Diagnosis present

## 2022-01-25 DIAGNOSIS — M25572 Pain in left ankle and joints of left foot: Secondary | ICD-10-CM | POA: Insufficient documentation

## 2022-01-25 DIAGNOSIS — M533 Sacrococcygeal disorders, not elsewhere classified: Secondary | ICD-10-CM | POA: Insufficient documentation

## 2022-01-25 DIAGNOSIS — R278 Other lack of coordination: Secondary | ICD-10-CM | POA: Insufficient documentation

## 2022-01-25 NOTE — Patient Instructions (Signed)
?  childs poses rocking on the bed ? ?Toes tucked, shoulders down and back, on forearms , hands shoulder width apart ? ?10 reps  ? ?__ ? ? Open book (handout)  ?L sidelying, rotating to R only  ? 15 reps  ? ?___ ? ? ? ?kitchen counter stretches ? ?Hands on kitchen counter,  ? ?Palms shoulder width apart  ?Minisquat postion ?Trunk is parallel to floor ? ?A) Pull buttocks back to lengthen spine, knees bent  ?3 breaths ? ? ?B) Bring R hand to the L, and stretch the R side trunk  ?3 breaths  ? ?Brings hands to center again ?Do the same to the L side stretch by placing L hand on top of R  ? ?D) Modified thread the needle ?R hand on L thigh, ?L  thigh pushing out slightly as the R hands pull in,  elbow bent and pulls to theR,  ?Look under L armpit  ? ?Do the same to other side ? ?____ ? ? ? ? ?

## 2022-01-25 NOTE — Therapy (Signed)
[place] ?[place] [facility] REHAB SERVICES ?[place], [place], [address] ?Phone: [phone]   Fax:  [phone] ? ?Physical Therapy Treatment ? ?Patient Details  ?Name: Jasmine Welch ?MRN: [id number] ?Date of Birth: 04-May-1946 ?Referring Provider (PT): Leafy Ro MD ? ? ?Encounter Date: 01/25/2022 ? ? PT End of Session - 01/25/22 0959   ? ? Visit Number 5   ? Number of Visits 10   ? Date for PT Re-Evaluation 03/16/22   ? PT Start Time [phone]   ? PT Stop Time 1000   ? PT Time Calculation (min) 56 min   ? Activity Tolerance Patient tolerated treatment well;No increased pain   ? Behavior During Therapy [facility] for tasks assessed/performed   ? ?  ?  ? ?  ? ? ?Past Medical History:  ?Diagnosis Date  ? Anemia   ? Diabetes mellitus without complication ([place])   ? Fatty liver   ? Gastric ulcer due to Helicobacter pylori   ? GERD (gastroesophageal reflux disease)   ? History of esophageal stricture   ? Hyperlipidemia   ? Hyperplastic colon polyp   ? Hypertension   ? Right ankle pain   ? ? ?Past Surgical History:  ?Procedure Laterality Date  ? ABDOMINAL HYSTERECTOMY    ? carpel tunnel    ? CHOLECYSTECTOMY    ? COLONOSCOPY N/A 05/25/2015  ? Procedure: COLONOSCOPY;  Surgeon: Lollie Sails, MD;  Location: [facility] ENDOSCOPY;  Service: Endoscopy;  Laterality: N/A;  ? ESOPHAGOGASTRODUODENOSCOPY N/A 12/13/2018  ? Procedure: ESOPHAGOGASTRODUODENOSCOPY (EGD);  Surgeon: Lollie Sails, MD;  Location: [facility] ENDOSCOPY;  Service: Endoscopy;  Laterality: N/A;  ? mamoplastic reduction    ? REDUCTION MAMMAPLASTY Bilateral 1973  ? TONSILLECTOMY    ? TUBAL LIGATION    ? ? ?There were no vitals filed for this visit. ? ? Subjective Assessment - 01/25/22 1219   ? ? Subjective Pt still wears her shoe lift in R shoe with no issues. Pt feels L hip is slightly off compared to R. Pt feels tailbone pain when lying down   ? 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 Patient Stated Goals see improvement with pelvic organs in proper position and not see it when lifting her leg   ? ?  ?  ? ?  ? ? ? ? ? [facility] PT Assessment - 01/25/22 1218   ? ?  ? AROM  ? Overall AROM Comments less AROM on R sideflexion and rotation compared L   ?  ? Palpation  ? Spinal mobility R T/L junction hypomobile, tightenss along paraspinals , convex curve R   ?  ? Ambulation/Gait  ? Gait Comments L trunk lean   ? ?  ?  ? ?  ? ? ? ? ? ? ? ? ? ? ? ? ? ? ? ? [place] Adult PT Treatment/Exercise - 01/25/22 1212   ? ?  ? Neuro Re-ed   ? Neuro Re-ed Details  cued for spinal stretches and one sided stretches   ?  ? Manual Therapy  ? Manual therapy comments STM/MWM to promote mobility at T/L junction, anterior rib excursion R, spinal rotation/ sideflexion   ? ?  ?  ? ?  ? ? ? ? ? ? ? ? ? ? ? ? ? ? ?  PT Long Term Goals - 01/05/22 0839   ? ?  ? PT LONG TERM GOAL #1  ? Title Pt will improve her FOTO lumbar score by > 5 pts in order to imrpove QOL and ADLs   ? Baseline 56 pts   ? Time 10   ? Period Weeks   ? Status New   ? Target Date 03/16/22   ?  ? PT LONG TERM GOAL #2  ? Title Pt will lower her score on FOTO prolapse from 33pts , Bowel Problem 25 pts by more than 5 pts in order to restore pelvic function   ? Time 10   ? Period Weeks   ? Status New   ? Target Date 03/16/22   ?  ? PT LONG TERM GOAL #3  ? Title Pt will demo equal pelvic girdle and shoulder height acriss 2 visits with shoe lift in R shoe in order to minimize coccyx pain and progress to deep core HEP   ? Time 2   ? Period Weeks   ? Status New   ? Target Date 01/19/22   ?  ? PT LONG TERM GOAL #4  ? Title Pt will demo no limp in her gait and report > 50 % pain at L ankle in order to minimzie risk of falls   ? Time 6   ? Period Weeks   ? Status New   ? Target Date 02/16/22   ?  ? PT LONG TERM GOAL #5  ? Title Pt will demo proper  technique for deep core in order to improve IAP system for less straining with  bowel movements   ? Baseline dyscoordination   ? Time 6   ? Period Weeks   ? Status New   ? Target Date 02/16/22   ?  ? Additional Long Term Goals  ? Additional Long Term Goals Yes   ?  ? PT LONG TERM GOAL #6  ? Title Pt will demo proper body  mechanics in her ADLs and yardwork in order to minimzie worsening of prolapse   ? Time 4   ? Status New   ? Target Date 02/02/22   ?  ? PT LONG TERM GOAL #7  ? Title Pt will report no longer needing to experiencing rectal spasms, straining 50% less , less difficulty with elimination, and having stool type 4 50% of the time instead of Type 2 and 6 100% of the time   ? Baseline she still feels the urge to have a toilet. Pt has strain to have a BM but still nothing comes out. Frequency of BM occurs daily or twice a day. [place] Stool Type 2, 6   ? Time 10   ? Period Weeks   ? Status New   ? Target Date 03/16/22   ? ?  ?  ? ?  ? ? ? ? ? ? ? ? Plan - 01/25/22 1000   ? ? Clinical Impression Statement Pt no longer felt the pain along tailbone after today's session. pt also demo'd increased R rotation and sideflexion of her spine post Tx. Addressed scoliotic curves at T/L junction 2/2 leg length difference. Pt also achieved increased diaphramgatic excursion on her R side which will help her to progress towards deep core coordinaiton at next session to address pelvic floor issues. Pt continues to benefit from skilled PT.   ? Personal Factors and Comorbidities Comorbidity 3+;Profession   ? Examination-Activity Limitations Stand;Toileting;Continence;Locomotion Level;Carry;Squat;Lift   ?  Stability/Clinical Decision Making Evolving/Moderate complexity   ? Rehab Potential Good   ? PT Frequency 1x / week   ? PT Duration Other (comment)   10  ? PT Treatment/Interventions Canalith Repostioning;Therapeutic exercise;Balance training;Therapeutic activities;Patient/family education;Functional mobility training;Stair  training;Neuromuscular re-education;Moist Heat;Traction;Scar mobilization;Manual techniques;Energy conservation;Splinting;Joint Manipulations;Spinal Manipulations;Taping;Manual lymph drainage;ADLs/Self Care Home Management;Gait training   ? Consulted and Agree with Plan of Care Patient   ? ?  ?  ? ?  ? ? ?Patient will benefit from skilled therapeutic intervention in order to improve the following deficits and impairments:  Decreased balance, Decreased activity tolerance, Decreased skin integrity, Decreased mobility, Decreased strength, Decreased scar mobility, Decreased endurance, Decreased coordination, Decreased range of motion, Increased muscle spasms, Hypomobility, Improper body mechanics, Impaired sensation, Difficulty walking ? ?Visit Diagnosis: ?Sacrococcygeal disorders, not elsewhere classified ? ?Unequal leg length ? ?Other lack of coordination ? ?Acute left ankle pain ? ?Other abnormalities of gait and mobility ? ?Chronic bilateral low back pain without sciatica ? ? ? ? ?Problem List ?There are no problems to display for this patient. ? ? ?Jerl Mina, PT ?01/25/2022, 12:20 PM ? ?[place] ?[place] [facility] ?[place], [place], [address] ?Phone: [phone]   Fax:  [phone] ? ?Name: SUEELLEN KAYES ?MRN: [id number] ?Date of Birth: [birth year] ? ? ? ?

## 2022-02-01 ENCOUNTER — Ambulatory Visit: Payer: Medicare PPO | Admitting: Physical Therapy

## 2022-02-01 ENCOUNTER — Other Ambulatory Visit: Payer: Self-pay

## 2022-02-01 DIAGNOSIS — M2170 Unequal limb length (acquired), unspecified site: Secondary | ICD-10-CM

## 2022-02-01 DIAGNOSIS — R2689 Other abnormalities of gait and mobility: Secondary | ICD-10-CM

## 2022-02-01 DIAGNOSIS — R278 Other lack of coordination: Secondary | ICD-10-CM

## 2022-02-01 DIAGNOSIS — M533 Sacrococcygeal disorders, not elsewhere classified: Secondary | ICD-10-CM

## 2022-02-01 DIAGNOSIS — M25572 Pain in left ankle and joints of left foot: Secondary | ICD-10-CM

## 2022-02-01 DIAGNOSIS — G8929 Other chronic pain: Secondary | ICD-10-CM

## 2022-02-01 NOTE — Patient Instructions (Signed)
°  Deep core level 1-2 ( handout) ° ° ° ° °

## 2022-02-01 NOTE — Therapy (Signed)
[place] ?[facility] ?[place], [place], [address] ?Phone: [phone]   Fax:  [phone] ? ?Physical Therapy Treatment ? ?Patient Details  ?Name: Jasmine Welch ?MRN: [id number] ?Date of Birth: June 12, 1946 ?Referring Provider (PT): Leafy Ro MD ? ? ?Encounter Date: 02/01/2022 ? ? PT End of Session - 02/01/22 0917   ? ? Visit Number 6   ? Number of Visits 10   ? Date for PT Re-Evaluation 03/16/22   ? PT Start Time 0908   ? PT Stop Time 1004   ? PT Time Calculation (min) 56 min   ? Activity Tolerance Patient tolerated treatment well;No increased pain   ? Behavior During Therapy [facility] for tasks assessed/performed   ? ?  ?  ? ?  ? ? ?Past Medical History:  ?Diagnosis Date  ? Anemia   ? Diabetes mellitus without complication ([place])   ? Fatty liver   ? Gastric ulcer due to Helicobacter pylori   ? GERD (gastroesophageal reflux disease)   ? History of esophageal stricture   ? Hyperlipidemia   ? Hyperplastic colon polyp   ? Hypertension   ? Right ankle pain   ? ? ?Past Surgical History:  ?Procedure Laterality Date  ? ABDOMINAL HYSTERECTOMY    ? carpel tunnel    ? CHOLECYSTECTOMY    ? COLONOSCOPY N/A 05/25/2015  ? Procedure: COLONOSCOPY;  Surgeon: Lollie Sails, MD;  Location: [facility] ENDOSCOPY;  Service: Endoscopy;  Laterality: N/A;  ? ESOPHAGOGASTRODUODENOSCOPY N/A 12/13/2018  ? Procedure: ESOPHAGOGASTRODUODENOSCOPY (EGD);  Surgeon: Lollie Sails, MD;  Location: [facility] ENDOSCOPY;  Service: Endoscopy;  Laterality: N/A;  ? mamoplastic reduction    ? REDUCTION MAMMAPLASTY Bilateral 1973  ? TONSILLECTOMY    ? TUBAL LIGATION    ? ? ?There were no vitals filed for this visit. ? ? Subjective Assessment - 02/01/22 0914   ? ? Subjective Pt has no more tailbone pain when ltying on her back. Pt feels 60-70% better with LBP.   Pt is noticing thicker discharge, when it dries , it is white. Denied spotting of blood. Pt worked in the yard alot and has not noticed worsening of  pressure sensation.   ? 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 Patient Stated Goals see improvement with pelvic organs in proper position and not see it when lifting her leg   ? ?  ?  ? ?  ? ? ? ? ? [facility] PT Assessment - 02/01/22 0920   ? ?  ? Observation/Other Assessments  ? Scoliosis levelled shoulder/ iliac crests   ?  ? Coordination  ? Coordination and Movement Description limited diaphragmatic excursion and  ab overuse in deep core   ? ?  ?  ? ?  ? ? ? ? ? ? ? ? ? ? ? ? ? Pelvic Floor Special Questions - 02/01/22 1003   ? ? External Perineal Exam through clothing, tightness at pubic symphysis attachments mm tightness   ? ?  ?  ? ?  ? ? ? ? [facility] PT Treatment/Exercise - 02/01/22 1001   ? ?  ? Neuro Re-ed   ? Neuro Re-ed Details  cued for optimal diaphramgatic excursion and less ab overuse in deep core   ?  ? Moist Heat Therapy  ?  Moist Heat Location --   ppubic sym/ perineum , butterfly pose, supported  ?  ? Manual Therapy  ? Manual therapy comments STM/MWM at pubic symphysis mm attachments R   ? ?  ?  ? ?  ? ? ? ? ? ? ? ? ? ? ? ? ? ? ? PT Long Term Goals - 01/05/22 0839   ? ?  ? PT LONG TERM GOAL #1  ? Title Pt will improve her FOTO lumbar score by > 5 pts in order to imrpove QOL and ADLs   ? Baseline 56 pts   ? Time 10   ? Period Weeks   ? Status New   ? Target Date 03/16/22   ?  ? PT LONG TERM GOAL #2  ? Title Pt will lower her score on FOTO prolapse from 33pts , Bowel Problem 25 pts by more than 5 pts in order to restore pelvic function   ? Time 10   ? Period Weeks   ? Status New   ? Target Date 03/16/22   ?  ? PT LONG TERM GOAL #3  ? Title Pt will demo equal pelvic girdle and shoulder height acriss 2 visits with shoe lift in R shoe in order to minimize coccyx pain and progress to deep core HEP   ? Time 2   ? Period  Weeks   ? Status New   ? Target Date 01/19/22   ?  ? PT LONG TERM GOAL #4  ? Title Pt will demo no limp in her gait and report > 50 % pain at L ankle in order to minimzie risk of falls   ? Time 6   ? Period Weeks   ? Status New   ? Target Date 02/16/22   ?  ? PT LONG TERM GOAL #5  ? Title Pt will demo proper technique for deep core in order to improve IAP system for less straining with  bowel movements   ? Baseline dyscoordination   ? Time 6   ? Period Weeks   ? Status New   ? Target Date 02/16/22   ?  ? Additional Long Term Goals  ? Additional Long Term Goals Yes   ?  ? PT LONG TERM GOAL #6  ? Title Pt will demo proper body  mechanics in her ADLs and yardwork in order to minimzie worsening of prolapse   ? Time 4   ? Status New   ? Target Date 02/02/22   ?  ? PT LONG TERM GOAL #7  ? Title Pt will report no longer needing to experiencing rectal spasms, straining 50% less , less difficulty with elimination, and having stool type 4 50% of the time instead of Type 2 and 6 100% of the time   ? Baseline she still feels the urge to have a toilet. Pt has strain to have a BM but still nothing comes out. Frequency of BM occurs daily or twice a day. [place] Stool Type 2, 6   ? Time 10   ? Period Weeks   ? Status New   ? Target Date 03/16/22   ? ?  ?  ? ?  ? ? ? ? ? ? ? ? Plan - 02/01/22 1306   ? ? Clinical Impression Statement Pt has made 60-70% improvement with LBP baased on her subjective report. Pt demo'd levelled spine and pelvic alignment.   ? ? Pt  was able to garden in her yard  and not notice her prolapse as much this week.  ? ? Advanced pt today to deep core HEP and provided cues for less ab overuse for proper coordination. External manual Tx helped to minimize R pelvic floor mm tightness which is related to R leg being shorter than L and now is adjusted with shoe lift.  ?Pt demo'd improved lengthening of pelvic floor after Tx which helped her to perform deep core coordination better and will help with IAP system for  improving prolapse  and postural stability to continue decreasing LBP.  ? ? ?Pt continues to benefit from skilled PT.  ?  ? Personal Factors and Comorbidities Comorbidity 3+;Profession   ? Examination-Activity Limitations Stand;Toileting;Continence;Locomotion Level;Carry;Squat;Lift   ? Stability/Clinical Decision Making Evolving/Moderate complexity   ? Rehab Potential Good   ? PT Frequency 1x / week   ? PT Duration Other (comment)   10  ? PT Treatment/Interventions Canalith Repostioning;Therapeutic exercise;Balance training;Therapeutic activities;Patient/family education;Functional mobility training;Stair training;Neuromuscular re-education;Moist Heat;Traction;Scar mobilization;Manual techniques;Energy conservation;Splinting;Joint Manipulations;Spinal Manipulations;Taping;Manual lymph drainage;ADLs/Self Care Home Management;Gait training   ? Consulted and Agree with Plan of Care Patient   ? ?  ?  ? ?  ? ? ?Patient will benefit from skilled therapeutic intervention in order to improve the following deficits and impairments:  Decreased balance, Decreased activity tolerance, Decreased skin integrity, Decreased mobility, Decreased strength, Decreased scar mobility, Decreased endurance, Decreased coordination, Decreased range of motion, Increased muscle spasms, Hypomobility, Improper body mechanics, Impaired sensation, Difficulty walking ? ?Visit Diagnosis: ?Sacrococcygeal disorders, not elsewhere classified ? ?Unequal leg length ? ?Acute left ankle pain ? ?Other lack of coordination ? ?Other abnormalities of gait and mobility ? ?Chronic bilateral low back pain without sciatica ? ? ? ? ?Problem List ?There are no problems to display for this patient. ? ? ?Jerl Mina, PT ?02/01/2022, 1:06 PM ? ?[place] ?[facility] ?[place], [place], [address] ?Phone: [phone]   Fax:  [phone] ? ?Name: Jasmine Welch ?MRN: [id number] ?Date of Birth:  21-Oct-1946 ? ? ? ?

## 2022-02-08 ENCOUNTER — Ambulatory Visit: Payer: Medicare PPO | Admitting: Physical Therapy

## 2022-02-08 ENCOUNTER — Other Ambulatory Visit: Payer: Self-pay

## 2022-02-08 DIAGNOSIS — R278 Other lack of coordination: Secondary | ICD-10-CM

## 2022-02-08 DIAGNOSIS — M25572 Pain in left ankle and joints of left foot: Secondary | ICD-10-CM

## 2022-02-08 DIAGNOSIS — M533 Sacrococcygeal disorders, not elsewhere classified: Secondary | ICD-10-CM | POA: Diagnosis not present

## 2022-02-08 DIAGNOSIS — M2170 Unequal limb length (acquired), unspecified site: Secondary | ICD-10-CM

## 2022-02-08 DIAGNOSIS — M545 Low back pain, unspecified: Secondary | ICD-10-CM

## 2022-02-08 DIAGNOSIS — R2689 Other abnormalities of gait and mobility: Secondary | ICD-10-CM

## 2022-02-08 NOTE — Patient Instructions (Signed)
Happy Baby pose ? ? ?Knees to chest, by armpit,  ?To lengthen pelvic floor  ? ?___ ? ?Pelvic tilt anteriorly before starting deep core level 1-2  ? ?

## 2022-02-08 NOTE — Therapy (Signed)
[place] ?[facility] ?[place], [place], [address] ?Phone: [phone]   Fax:  [phone] ? ?Physical Therapy Treatment ? ?Patient Details  ?Name: Jasmine Welch ?MRN: [id number] ?Date of Birth: [birth year] ?Referring Provider (PT): Leafy Ro MD ? ? ?Encounter Date: 02/08/2022 ? ? PT End of Session - 02/08/22 0921   ? ? Visit Number 7   ? Number of Visits 10   ? Date for PT Re-Evaluation 03/16/22   ? PT Start Time [phone]   ? PT Stop Time 1000   ? PT Time Calculation (min) 57 min   ? Activity Tolerance Patient tolerated treatment well;No increased pain   ? Behavior During Therapy [facility] for tasks assessed/performed   ? ?  ?  ? ?  ? ? ?Past Medical History:  ?Diagnosis Date  ? Anemia   ? Diabetes mellitus without complication ([place])   ? Fatty liver   ? Gastric ulcer due to Helicobacter pylori   ? GERD (gastroesophageal reflux disease)   ? History of esophageal stricture   ? Hyperlipidemia   ? Hyperplastic colon polyp   ? Hypertension   ? Right ankle pain   ? ? ?Past Surgical History:  ?Procedure Laterality Date  ? ABDOMINAL HYSTERECTOMY    ? carpel tunnel    ? CHOLECYSTECTOMY    ? COLONOSCOPY N/A 05/25/2015  ? Procedure: COLONOSCOPY;  Surgeon: Lollie Sails, MD;  Location: [facility] ENDOSCOPY;  Service: Endoscopy;  Laterality: N/A;  ? ESOPHAGOGASTRODUODENOSCOPY N/A 12/13/2018  ? Procedure: ESOPHAGOGASTRODUODENOSCOPY (EGD);  Surgeon: Lollie Sails, MD;  [facility] ENDOSCOPY;  Service: Endoscopy;  Laterality: N/A;  ? mamoplastic reduction    ? REDUCTION MAMMAPLASTY Bilateral 1973  ? TONSILLECTOMY    ? TUBAL LIGATION    ? ? ?There were no vitals filed for this visit. ? ? Subjective Assessment - 02/08/22 0927   ? ? Subjective Pt reports she is not straining for BM as much. Pt notices when she is urinating, the urine comes out fanning across. Pt has not noticed her prolapse as much   ? 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 Patient Stated Goals see improvement with pelvic organs in proper position and not see it when lifting her leg   ? ?  ?  ? ?  ? ? ? ? ? ? ? ? ? ? ? ? ? ? ? ? ? Pelvic Floor Special Questions - 02/08/22 1006   ? ? Pelvic Floor Internal Exam pt consented verbally and had no contraindications   ? Exam Type Vaginal   ? Palpation tightness at 5-7  oclock 2-3rd layers, anterior attachments to pubirectalis mm,  coccgeus, lowered bladder inside introitus, descent of urethral and bladder with cue for cough, upward movemetn cue caused pain.   ? ?  ?  ? ?  ? ? ? ? [facility] Adult PT Treatment/Exercise - 02/08/22 1048   ? ?  ? Therapeutic Activites   ? Other Therapeutic Activities reassessed goals,   ?  ? Neuro Re-ed   ? Neuro Re-ed Details  cued for anterior tilt of pelvis for improved upward movement of pelvic organs/mm   ?  ? Moist Heat Therapy  ? Number Minutes Moist Heat 5 Minutes   ? Moist Heat Location --   through  sheets at perineum, butterfly pose  ?  ? Manual Therapy  ? Internal Pelvic Floor STM/MWM , lighter pressure when pt reported tenderness/ pain to achieve tolerable pressure level, to decrease tenderness/tensions of pelvic floor and elicit more upward movement of pelvic organs   ? ?  ?  ? ?  ? ? ? ? ? ? ? ? ? ? ? ? ? ? ? PT Long Term Goals - 02/08/22 0922   ? ?  ? PT LONG TERM GOAL #1  ? Title Pt will improve her FOTO lumbar score by > 5 pts in order to imrpove QOL and ADLs   ? Baseline 56 pts   ? Time 10   ? Period Weeks   ? Status On-going   ? Target Date 03/16/22   ?  ? PT LONG TERM GOAL #2  ? Title Pt will lower her score on FOTO prolapse from 33pts , Bowel Problem 25 pts by more than 5 pts in order to restore pelvic function   ? Time 10   ? Period Weeks   ? Status On-going   ? Target Date 03/16/22   ?  ? PT LONG TERM GOAL #3  ? Title Pt will demo equal pelvic  girdle and shoulder height acriss 2 visits with shoe lift in R shoe in order to minimize coccyx pain and progress to deep core HEP   ? Time 2   ? Period Weeks   ? Status Achieved   ? Target Date 01/19/22   ?  ? PT LONG TERM GOAL #4  ? Title Pt will demo no limp in her gait and report < 50 % pain at L ankle in order to minimzie risk of falls   ? Time 6   ? Period Weeks   ? Status Achieved   ? Target Date 02/16/22   ?  ? PT LONG TERM GOAL #5  ? Title Pt will demo proper technique for deep core in order to improve IAP system for less straining with  bowel movements   ? Baseline dyscoordination   ? Time 6   ? Period Weeks   ? Status Achieved   ? Target Date 02/16/22   ?  ? PT LONG TERM GOAL #6  ? Title Pt will demo proper body  mechanics in her ADLs and yardwork in order to minimzie worsening of prolapse   ? Time 4   ? Status Achieved   ? Target Date 02/02/22   ?  ? PT LONG TERM GOAL #7  ? Title Pt will report no longer needing to experiencing rectal spasms, straining 50% less , less difficulty with elimination, and having stool type 4 50% of the time instead of Type 2 and 6 100% of the time  ( 02/08/22: no  rectal spasms, straining 50% less, Stool Type 4  50%   ? Baseline she still feels the urge to have a toilet. Pt has strain to have a BM but still nothing comes out. Frequency of BM occurs daily or twice a day. [place] Stool Type 2, 6   ? Time 10   ? Period Weeks   ? Status Achieved   ? Target Date 03/16/22   ? ?  ?  ? ?  ? ? ? ? ? ? ? ? Plan - 02/08/22 1050   ? ? Clinical Impression Statement Pt reports noticing less prolapse, improved bowel movements with less straining, less rectal spasms, and improved stool consistency of Type  4 about 50% of the time instead of Type 6-7 which had occurred 100% prior to [facility].  ? ?Internal pelvic floor assessment today showed increased pelvic floor mm tightness posterior, anterior aspects. Lowered bladder position but it was inside introitus. Withholding kegel strengthening due to  pain reported with contraction and pt still shows increased mm tightness.  ? ?Plan to continue with internal pelvic floor assessment and Tx at next session per pt consent. Defer to external Tx if pt prefers to not do internal Tx.  ?Pt continues to benefit from skilled PT.   ? Personal Factors and Comorbidities Comorbidity 3+;Profession   ? Examination-Activity Limitations Stand;Toileting;Continence;Locomotion Level;Carry;Squat;Lift   ? Stability/Clinical Decision Making Evolving/Moderate complexity   ? Rehab Potential Good   ? PT Frequency 1x / week   ? PT Duration Other (comment)   10  ? PT Treatment/Interventions Canalith Repostioning;Therapeutic exercise;Balance training;Therapeutic activities;Patient/family education;Functional mobility training;Stair training;Neuromuscular re-education;Moist Heat;Traction;Scar mobilization;Manual techniques;Energy conservation;Splinting;Joint Manipulations;Spinal Manipulations;Taping;Manual lymph drainage;ADLs/Self Care Home Management;Gait training   ? Consulted and Agree with Plan of Care Patient   ? ?  ?  ? ?  ? ? ?Patient will benefit from skilled therapeutic intervention in order to improve the following deficits and impairments:  Decreased balance, Decreased activity tolerance, Decreased skin integrity, Decreased mobility, Decreased strength, Decreased scar mobility, Decreased endurance, Decreased coordination, Decreased range of motion, Increased muscle spasms, Hypomobility, Improper body mechanics, Impaired sensation, Difficulty walking ? ?Visit Diagnosis: ?Unequal leg length ? ?Acute left ankle pain ? ?Other lack of coordination ? ?Other abnormalities of gait and mobility ? ?Chronic bilateral low back pain without sciatica ? ? ? ? ?Problem List ?There are no problems to display for this patient. ? ? ?Jerl Mina, PT ?02/08/2022, 10:51 AM ? ?[place] ?[facility] ?[place], [place], [address] ?Phone:  [phone]   Fax:  [phone] ? ?Name: Jasmine Welch ?MRN: [id number] ?Date of Birth: May 31, 1946 ? ? ? ?

## 2022-02-15 ENCOUNTER — Ambulatory Visit: Payer: Medicare PPO | Admitting: Physical Therapy

## 2022-02-15 ENCOUNTER — Other Ambulatory Visit: Payer: Self-pay

## 2022-02-15 DIAGNOSIS — M25572 Pain in left ankle and joints of left foot: Secondary | ICD-10-CM

## 2022-02-15 DIAGNOSIS — G8929 Other chronic pain: Secondary | ICD-10-CM

## 2022-02-15 DIAGNOSIS — M533 Sacrococcygeal disorders, not elsewhere classified: Secondary | ICD-10-CM

## 2022-02-15 DIAGNOSIS — R278 Other lack of coordination: Secondary | ICD-10-CM

## 2022-02-15 DIAGNOSIS — R2689 Other abnormalities of gait and mobility: Secondary | ICD-10-CM

## 2022-02-15 DIAGNOSIS — M2170 Unequal limb length (acquired), unspecified site: Secondary | ICD-10-CM

## 2022-02-15 NOTE — Therapy (Signed)
[place] ?[facility] ?[place], [place], [address] ?Phone: [phone]   Fax:  [phone] ? ?Physical Therapy Treatment ? ?Patient Details  ?Name: Jasmine Welch ?MRN: [id number] ?Date of Birth: 1946/11/10 ?Referring Provider (PT): Leafy Ro MD ? ? ?Encounter Date: 02/15/2022 ? ? PT End of Session - 02/15/22 0917   ? ? Visit Number 8   ? Number of Visits 10   ? Date for PT Re-Evaluation 03/16/22   ? PT Start Time [phone]   ? PT Stop Time 1009   ? PT Time Calculation (min) 59 min   ? Activity Tolerance Patient tolerated treatment well;No increased pain   ? Behavior During Therapy [facility] for tasks assessed/performed   ? ?  ?  ? ?  ? ? ?Past Medical History:  ?Diagnosis Date  ? Anemia   ? Diabetes mellitus without complication ([place])   ? Fatty liver   ? Gastric ulcer due to Helicobacter pylori   ? GERD (gastroesophageal reflux disease)   ? History of esophageal stricture   ? Hyperlipidemia   ? Hyperplastic colon polyp   ? Hypertension   ? Right ankle pain   ? ? ?Past Surgical History:  ?Procedure Laterality Date  ? ABDOMINAL HYSTERECTOMY    ? carpel tunnel    ? CHOLECYSTECTOMY    ? COLONOSCOPY N/A 05/25/2015  ? Procedure: COLONOSCOPY;  Surgeon: Lollie Sails, MD;  Location: [facility] ENDOSCOPY;  Service: Endoscopy;  Laterality: N/A;  ? ESOPHAGOGASTRODUODENOSCOPY N/A 12/13/2018  ? Procedure: ESOPHAGOGASTRODUODENOSCOPY (EGD);  Surgeon: Lollie Sails, MD;  Location: [facility] ENDOSCOPY;  Service: Endoscopy;  Laterality: N/A;  ? mamoplastic reduction    ? REDUCTION MAMMAPLASTY Bilateral 1973  ? TONSILLECTOMY    ? TUBAL LIGATION    ? ? ?There were no vitals filed for this visit. ? ? Subjective Assessment - 02/15/22 0915   ? ? Subjective Pt is straining less with bowel movements. Pt reports she had no LBP except when carrying stuff up the stairs helping her daughter move.  Pt noticed her proalpse was bulging but it is not worse.   ? 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 Patient Stated Goals see improvement with pelvic organs in proper position and not see it when lifting her leg   ? ?  ?  ? ?  ? ? ? ? ? [facility] PT Assessment - 02/15/22 1002   ? ?  ? Palpation  ? SI assessment  hypomobile B SIJ,  coccgeyus B , L hamstring,  pain with palpation at R lateral to coccxy, L lateral thigh  ? ?  ?  ? ?  ? ? ? ? ? ? ? ? ? ?  ? ? ? ? [facility] Adult PT Treatment/Exercise - 02/15/22 1003   ? ?  ? Therapeutic Activites   ? Other Therapeutic Activities provided shoe lift information, explained fitting of she lift,   ?  ? Neuro Re-ed   ? Neuro Re-ed Details  cued for SIJ stretches   ?  ? Moist Heat Therapy  ? Number Minutes Moist Heat 5 Minutes   ? Moist Heat Location --   SIJ, unbilled ( in supine twist position for SIJ mobility)  ?  ? Manual Therapy  ? Manual therapy comments long axis distraction,  STM/MWM along problem areas noted in assessment to promote SIJ mobility   ? ?  ?  ? ?  ? ? ? ? ? ? ? ? ? ? ? ? ? ? ? PT Long Term Goals - 02/15/22 0925   ? ?  ? PT LONG TERM GOAL #1  ? Title Pt will improve her FOTO lumbar score by > 5 pts in order to imrpove QOL and ADLs   ? Baseline 56 pts  ( 02/15/22 : 67 pts)   ? Time 10   ? Period Weeks   ? Status Achieved   ? Target Date 03/16/22   ?  ? PT LONG TERM GOAL #2  ? Title Pt will lower her score on FOTO prolapse from 33pts , Bowel Problem 25 pts by more than 5 pts in order to restore pelvic function   ? Time 10   ? Period Weeks   ? Status On-going   ? Target Date 03/16/22   ?  ? PT LONG TERM GOAL #3  ? Title Pt will demo equal pelvic girdle and shoulder height acriss 2 visits with shoe lift in R shoe in order to minimize coccyx pain and progress to deep core HEP   ? Time 2   ? Period Weeks   ? Status Achieved   ? Target Date 01/19/22   ?  ? PT LONG TERM GOAL  #4  ? Title Pt will demo no limp in her gait and report < 50 % pain at L ankle in order to minimzie risk of falls   ? Time 6   ? Period Weeks   ? Status Achieved   ? Target Date 02/16/22   ?  ? PT LONG TERM GOAL #5  ? Title Pt will demo proper technique for deep core in order to improve IAP system for less straining with  bowel movements   ? Baseline dyscoordination   ? Time 6   ? Period Weeks   ? Status Achieved   ? Target Date 02/16/22   ?  ? PT LONG TERM GOAL #6  ? Title Pt will demo proper body  mechanics in her ADLs and yardwork in order to minimzie worsening of prolapse   ? Time 4   ? Status Achieved   ? Target Date 02/02/22   ?  ? PT LONG TERM GOAL #7  ? Title Pt will report no longer needing to experiencing rectal spasms, straining 50% less , less difficulty with elimination, and having stool type 4 50% of the time instead of Type 2 and 6 100% of the time  ( 02/08/22: no  rectal spasms, straining 50% less, Stool Type 4  50%   ? Baseline she still feels the urge to have a toilet. Pt has strain to have a BM but still nothing comes out. Frequency of BM occurs daily or twice a day. [place] Stool Type 2, 6   ? Time 10   ? Period Weeks   ? Status Achieved   ? Target Date 03/16/22   ? ?  ?  ? ?  ? ? ? ? ? ? ? ? Plan - 02/15/22 0926   ? ? Clinical Impression Statement Pt required manual Tx to increase SIJ mobility. Pt demo'd increased pelvic floor lengthening post Tx and more SIJ mobility. Provided education on replacing shoe lift in other shoes. Pt continues to have resolved LBP with shoe lift wear. Continue to address prolapse Sx at further sessions.  Pt continues to benefit from skilled PT   ? Personal Factors and Comorbidities Comorbidity 3+;Profession   ? Examination-Activity Limitations Stand;Toileting;Continence;Locomotion Level;Carry;Squat;Lift   ? Stability/Clinical Decision Making Evolving/Moderate complexity   ? Rehab Potential Good   ? PT Frequency 1x / week   ? PT Duration Other (comment)   10  ? PT  Treatment/Interventions Canalith Repostioning;Therapeutic exercise;Balance training;Therapeutic activities;Patient/family education;Functional mobility training;Stair training;Neuromuscular re-education;Moist Heat;Traction;Scar mobilization;Manual techniques;Energy conservation;Splinting;Joint Manipulations;Spinal Manipulations;Taping;Manual lymph drainage;ADLs/Self Care Home Management;Gait training   ? Consulted and Agree with Plan of Care Patient   ? ?  ?  ? ?  ? ? ?Patient will benefit from skilled therapeutic intervention in order to improve the following deficits and impairments:  Decreased balance, Decreased activity tolerance, Decreased skin integrity, Decreased mobility, Decreased strength, Decreased scar mobility, Decreased endurance, Decreased coordination, Decreased range of motion, Increased muscle spasms, Hypomobility, Improper body mechanics, Impaired sensation, Difficulty walking ? ?Visit Diagnosis: ?Other lack of coordination ? ?Unequal leg length ? ?Acute left ankle pain ? ?Other abnormalities of gait and mobility ? ?Chronic bilateral low back pain without sciatica ? ?Sacrococcygeal disorders, not elsewhere classified ? ? ? ? ?Problem List ?There are no problems to display for this patient. ? ? ?Jerl Mina, PT ?02/15/2022, 10:15 AM ? ?[place] ?[facility] ?[place], [place], [address] ?Phone: [phone]   Fax:  [phone] ? ?Name: Jasmine Welch ?MRN: [id number] ?Date of Birth: 1946/07/27 ? ? ? ?

## 2022-02-15 NOTE — Patient Instructions (Signed)
?   Side of hip stretch: ? ?Reclined twist for hips and side of the hips/ legs ? ?Lay on your back, knees bend ?Scoot hips to the R , leave shoulders in place ?Drop knees to the L side ?resting onto pillows to keep leg at the same width of hips ?Pillow under L thigh to minimize too much strain  ? ?

## 2022-03-07 ENCOUNTER — Ambulatory Visit: Payer: Medicare PPO | Attending: Obstetrics and Gynecology | Admitting: Physical Therapy

## 2022-03-07 DIAGNOSIS — M2170 Unequal limb length (acquired), unspecified site: Secondary | ICD-10-CM | POA: Diagnosis present

## 2022-03-07 DIAGNOSIS — R2689 Other abnormalities of gait and mobility: Secondary | ICD-10-CM | POA: Insufficient documentation

## 2022-03-07 DIAGNOSIS — R278 Other lack of coordination: Secondary | ICD-10-CM | POA: Insufficient documentation

## 2022-03-07 DIAGNOSIS — M533 Sacrococcygeal disorders, not elsewhere classified: Secondary | ICD-10-CM | POA: Insufficient documentation

## 2022-03-07 DIAGNOSIS — M545 Low back pain, unspecified: Secondary | ICD-10-CM | POA: Insufficient documentation

## 2022-03-07 DIAGNOSIS — M25572 Pain in left ankle and joints of left foot: Secondary | ICD-10-CM | POA: Insufficient documentation

## 2022-03-07 DIAGNOSIS — G8929 Other chronic pain: Secondary | ICD-10-CM | POA: Diagnosis present

## 2022-03-07 NOTE — Patient Instructions (Addendum)
Figure- 4 stretch  ? ?Open book for midback / shoulders ? ?Angel wings  ? ?(These stretches daily and especially after gardening., computer work)  ?

## 2022-03-07 NOTE — Therapy (Signed)
[facility] ?[place], [place], [address] ?Phone: [phone]   Fax:  [phone] ? ?Physical Therapy Treatment ? ?Patient Details  ?Name: Jasmine Welch ?MRN: [id number] ?Date of Birth: 12-Apr-1946 ?Referring Provider (PT): Leafy Ro MD ? ? ?Encounter Date: 03/07/2022 ? ? PT End of Session - 03/07/22 0910   ? ? Visit Number 9   ? Number of Visits 10   ? Date for PT Re-Evaluation 03/16/22   ? PT Start Time 0900   ? PT Stop Time 1000   ? PT Time Calculation (min) 60 min   ? Activity Tolerance Patient tolerated treatment well;No increased pain   ? Behavior During Therapy [facility] for tasks assessed/performed   ? ?  ?  ? ?  ? ? ?Past Medical History:  ?Diagnosis Date  ? Anemia   ? Diabetes mellitus without complication ([place])   ? Fatty liver   ? Gastric ulcer due to Helicobacter pylori   ? GERD (gastroesophageal reflux disease)   ? History of esophageal stricture   ? Hyperlipidemia   ? Hyperplastic colon polyp   ? Hypertension   ? Right ankle pain   ? ? ?Past Surgical History:  ?Procedure Laterality Date  ? ABDOMINAL HYSTERECTOMY    ? carpel tunnel    ? CHOLECYSTECTOMY    ? COLONOSCOPY N/A 05/25/2015  ? Procedure: COLONOSCOPY;  Surgeon: Lollie Sails, MD;  Location: [facility] ENDOSCOPY;  Service: Endoscopy;  Laterality: N/A;  ? ESOPHAGOGASTRODUODENOSCOPY N/A 12/13/2018  ? Procedure: ESOPHAGOGASTRODUODENOSCOPY (EGD);  Surgeon: Lollie Sails, MD;  Location: [facility] ENDOSCOPY;  Service: Endoscopy;  Laterality: N/A;  ? mamoplastic reduction    ? REDUCTION MAMMAPLASTY Bilateral 1973  ? TONSILLECTOMY    ? TUBAL LIGATION    ? ? ?There were no vitals filed for this visit. ? ? Subjective Assessment - 03/07/22 0906   ? ? Subjective Pt feels a constant pain at R groin after pt was working in the garden last week, sitting on a stool for 4-5 hours . Pt reports she works in her garden with shoes that do not have her shoe lift in it.    Pt is not urinating more  frequently.  Pt has some problem with gout this morning in L big toe. Pt no longer has L ankle pain.  L hip hurts right before she gets up from sitting, getting out of a car, at a computer,  and then it works itself out when she starts walking.   ? 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 Patient Stated Goals see improvement with pelvic organs in proper position and not see it when lifting her leg   ? ?  ?  ? ?  ? ? ? ? ? [facility] PT Assessment - 03/07/22 1320   ? ?  ? Squat  ? Comments poor technique   ?  ? Palpation  ? SI assessment  tenderness at R PSIS,  lateral border of R hysterectomy scar reproduced the pain at back and R groin which pt reports doe snot occur simultaneously.  L lateral scar restricted as well , R SIJ hypomobile   ? ?  ?  ? ?  ? ? ? ? ? ? ? ? ? ? ? ? ? ? ? ? [facility]  PT Treatment/Exercise - 03/07/22 1317   ? ?  ? Therapeutic Activites   ? Other Therapeutic Activities explained chumking gardening activities to minimzie overuse of thoracic mm/ shoulder mm and back.   ?  ? Neuro Re-ed   ? Neuro Re-ed Details  cued for proper squat, figure -4 and open book   ?  ? Moist Heat Therapy  ? Number Minutes Moist Heat 5 Minutes   ? Moist Heat Location --   sacrum  ?  ? Manual Therapy  ? Manual therapy comments long axis distraction, STM/MWM along problem areas noted in assessment to promote SIJ mobility on R and fascial releases over hysterectomy and tubal ligation scars , STM/MWM R hamstrings   ? ?  ?  ? ?  ? ? ? ? ? ? ? ? ? ? ? ? ? ? ? PT Long Term Goals - 03/07/22 1323   ? ?  ? PT LONG TERM GOAL #1  ? Title Pt will improve her FOTO lumbar score by > 5 pts in order to imrpove QOL and ADLs   ? Baseline 56 pts  ( 02/15/22 : 67 pts)   ? Time 10   ? Period Weeks   ? Status Achieved   ? Target Date 03/16/22   ?  ? PT LONG  TERM GOAL #2  ? Title Pt will lower her score on FOTO prolapse from 33pts , Bowel Problem 25 pts by more than 5 pts in order to restore pelvic function   ? Time 10   ? Period Weeks   ? Status On-going   ? Target Date 03/16/22   ?  ? PT LONG TERM GOAL #3  ? Title Pt will demo equal pelvic girdle and shoulder height acriss 2 visits with shoe lift in R shoe in order to minimize coccyx pain and progress to deep core HEP   ? Time 2   ? Period Weeks   ? Status Achieved   ? Target Date 01/19/22   ?  ? PT LONG TERM GOAL #4  ? Title Pt will demo no limp in her gait and report < 50 % pain at L ankle in order to minimzie risk of falls   ? Time 6   ? Period Weeks   ? Status Achieved   ? Target Date 02/16/22   ?  ? PT LONG TERM GOAL #5  ? Title Pt will demo proper technique for deep core in order to improve IAP system for less straining with  bowel movements   ? Baseline dyscoordination   ? Time 6   ? Period Weeks   ? Status Achieved   ? Target Date 02/16/22   ?  ? Additional Long Term Goals  ? Additional Long Term Goals Yes   ?  ? PT LONG TERM GOAL #6  ? Title Pt will demo proper body  mechanics in her ADLs and yardwork in order to minimzie worsening of prolapse   ? Time 4   ? Status Achieved   ? Target Date 02/02/22   ?  ? PT LONG TERM GOAL #7  ? Title Pt will report no longer needing to experiencing rectal spasms, straining 50% less , less difficulty with elimination, and having stool type 4 50% of the time instead of Type 2 and 6 100% of the time  ( 02/08/22: no  rectal spasms, straining 50% less, Stool Type 4  50%   ? Baseline she still feels the urge  to have a toilet. Pt has strain to have a BM but still nothing comes out. Frequency of BM occurs daily or twice a day. [place] Stool Type 2, 6   ? Time 10   ? Period Weeks   ? Status Achieved   ? Target Date 03/16/22   ?  ? PT LONG TERM GOAL #8  ? Title Pt will report  no more R groin pain and L hip pain and be able to  sit to stand after sitting   ? Time 8   ? Period Weeks   ?  Status New   ? Target Date 05/02/22   ? ?  ?  ? ?  ? ? ? ? ? ? ? ? Plan - 03/07/22 0910   ? ? Clinical Impression Statement Pt required more manual Tx at SIJ, thorax, and hysterectomy scar to minimize R groin and LBP pain. Pt demo'd improved R SIJ mobility and decreased hysterectomy scar restrictions post Tx. Provided education on chunking activities to minimize overuse of thoracic / back mm.  Pt required cues for proper squat technique. Pt continues to benefit from skilled PT.   ? Personal Factors and Comorbidities Comorbidity 3+;Profession   ? Examination-Activity Limitations Stand;Toileting;Continence;Locomotion Level;Carry;Squat;Lift   ? Stability/Clinical Decision Making Evolving/Moderate complexity   ? Rehab Potential Good   ? PT Frequency 1x / week   ? PT Duration Other (comment)   10  ? PT Treatment/Interventions Canalith Repostioning;Therapeutic exercise;Balance training;Therapeutic activities;Patient/family education;Functional mobility training;Stair training;Neuromuscular re-education;Moist Heat;Traction;Scar mobilization;Manual techniques;Energy conservation;Splinting;Joint Manipulations;Spinal Manipulations;Taping;Manual lymph drainage;ADLs/Self Care Home Management;Gait training   ? Consulted and Agree with Plan of Care Patient   ? ?  ?  ? ?  ? ? ?Patient will benefit from skilled therapeutic intervention in order to improve the following deficits and impairments:  Decreased balance, Decreased activity tolerance, Decreased skin integrity, Decreased mobility, Decreased strength, Decreased scar mobility, Decreased endurance, Decreased coordination, Decreased range of motion, Increased muscle spasms, Hypomobility, Improper body mechanics, Impaired sensation, Difficulty walking ? ?Visit Diagnosis: ?Unequal leg length ? ?Acute left ankle pain ? ?Other lack of coordination ? ?Other abnormalities of gait and mobility ? ?Chronic bilateral low back pain without sciatica ? ?Sacrococcygeal disorders, not  elsewhere classified ? ? ? ? ?Problem List ?There are no problems to display for this patient. ? ? ?Jerl Mina, PT ?03/07/2022, 2:54 PM ? ?[place] [facility] ?[address]

## 2022-03-08 ENCOUNTER — Ambulatory Visit: Payer: Medicare PPO | Admitting: Physical Therapy

## 2022-03-15 ENCOUNTER — Ambulatory Visit: Payer: Medicare PPO | Admitting: Physical Therapy

## 2022-03-15 DIAGNOSIS — R278 Other lack of coordination: Secondary | ICD-10-CM

## 2022-03-15 DIAGNOSIS — R2689 Other abnormalities of gait and mobility: Secondary | ICD-10-CM

## 2022-03-15 DIAGNOSIS — M2170 Unequal limb length (acquired), unspecified site: Secondary | ICD-10-CM | POA: Diagnosis not present

## 2022-03-15 DIAGNOSIS — M25572 Pain in left ankle and joints of left foot: Secondary | ICD-10-CM

## 2022-03-15 DIAGNOSIS — M533 Sacrococcygeal disorders, not elsewhere classified: Secondary | ICD-10-CM

## 2022-03-15 DIAGNOSIS — G8929 Other chronic pain: Secondary | ICD-10-CM

## 2022-03-15 NOTE — Patient Instructions (Addendum)
Twice a day ? ?Morning and night  ? ?Open book ( midback)  ?Clam shells ? ?Deep core level 1-2   ? ?Open book ( midback)  ?Clam shells on the other side  ? ? ?_________ ? ?Wear shoe lift ? ?__________ ? ?Modify activities to not over do in the garden to minimize loading prolapse  ? ?________ ? ?See your MD this afternoon about Gout 1:45pm today ? ?See Marveen Reeks on 5/30 '@1'$ :45 about the pessary fitting  ?

## 2022-03-16 NOTE — Therapy (Signed)
[facility] ?[place], [place], [address] ?Phone: [phone]   Fax:  [phone] ? ?Physical Therapy Treatment / progress note from 01/04/22 to 03/15/22 across 10 visits  ? ?Patient Details  ?Name: Jasmine Welch ?MRN: [id number] ?Date of Birth: [birth year] ?Referring Provider (PT): Leafy Ro MD ? ? ?Encounter Date: 03/15/2022 ? ? PT End of Session - 03/16/22 1226   ? ? Visit Number 10   ? Date for PT Re-Evaluation 05/25/22   ? PT Start Time 0900   ? PT Stop Time 1000   ? PT Time Calculation (min) 60 min   ? Activity Tolerance Patient tolerated treatment well;No increased pain   ? Behavior During Therapy [facility] for tasks assessed/performed   ? ?  ?  ? ?  ? ? ?Past Medical History:  ?Diagnosis Date  ? Anemia   ? Diabetes mellitus without complication ([place])   ? Fatty liver   ? Gastric ulcer due to Helicobacter pylori   ? GERD (gastroesophageal reflux disease)   ? History of esophageal stricture   ? Hyperlipidemia   ? Hyperplastic colon polyp   ? Hypertension   ? Right ankle pain   ? ? ?Past Surgical History:  ?Procedure Laterality Date  ? ABDOMINAL HYSTERECTOMY    ? carpel tunnel    ? CHOLECYSTECTOMY    ? COLONOSCOPY N/A 05/25/2015  ? Procedure: COLONOSCOPY;  Surgeon: Lollie Sails, MD;  Location: [facility] ENDOSCOPY;  Service: Endoscopy;  Laterality: N/A;  ? ESOPHAGOGASTRODUODENOSCOPY N/A 12/13/2018  ? Procedure: ESOPHAGOGASTRODUODENOSCOPY (EGD);  Surgeon: Lollie Sails, MD;  Location: [facility] ENDOSCOPY;  Service: Endoscopy;  Laterality: N/A;  ? mamoplastic reduction    ? REDUCTION MAMMAPLASTY Bilateral 1973  ? TONSILLECTOMY    ? TUBAL LIGATION    ? ? ?There were no vitals filed for this visit. ? ? Subjective Assessment - 03/15/22 0915   ? ? Subjective Pt notices the prolapse more with bulging around her skin at the vagina and the passing of gas occurs when walking.  The gout on her L big toe is still an issue but she has not told her doctor. Tailbone  pain is better  the last couple of weeks since she got the shot but it has returned but much less. Pt is able to lie in bed and has no tailbone pain. Pt does her stretches in between her work. Pt has been doing a lot more yardwork with shovel, getting up and down, the past couple of weeks. Pt als wear 3 different pairs of shoes when working in tha yard and she has not been wearin gher shoe lift in any of these three pairs because she has not ordered them yet.   ? 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 Patient Stated Goals see improvement with pelvic organs in proper position and not see it when lifting her leg   ? ?  ?  ? ?  ? ? ? ? ? [facility] PT Assessment - 03/16/22 1548   ? ?  ? Observation/Other Assessments  ? Observations lateral area by L big toe with swelling, pt has not worn shoe lift in shoe across the past sessions   ?  ? Ambulation/Gait  ? Gait Comments decreased WBing on L, slight antalgic   ? ?  ?  ? ?  ? ? ? ? ? ? ? ? ? ? ? ? ? ? ? ?  [facility] PT Treatment/Exercise - 03/16/22 1550   ? ?  ? Therapeutic Activites   ? Other Therapeutic Activities active listening, motivaitonal interviewing, explained the importance of compliance with shoe lift wear because it had helped her with her back and maintaining pelvic girdle alignment with past session, called her MD to get her appt to get gout complaints addressed. Pt got an appt that afternoon. Discussed the role of pessary if she is interested in trying it, Called gynecologist office and schedule an appt end of May for pessary fitting as pt is interested.   ? ?  ?  ? ?  ? ? ? ? ? ? ? ? ? ? ? ? ? ? ? PT Long Term Goals - 03/15/22 0931   ? ?  ? PT LONG TERM GOAL #1  ? Title Pt will improve her FOTO lumbar score by > 5 pts in order to imrpove QOL and ADLs   ? Baseline 56 pts  (  02/15/22 : 67 pts)   ? Time 10   ? Period Weeks   ? Status Achieved   ? Target Date 03/16/22   ?  ? PT LONG TERM GOAL #2  ? Title Pt will lower her score on FOTO prolapse from 33pts , Bowel Problem 25 pts by more than 5 pts in order to restore pelvic function   ? Time 10   ? Period Weeks   ? Status On-going   ? Target Date 03/16/22   ?  ? PT LONG TERM GOAL #3  ? Title Pt will demo equal pelvic girdle and shoulder height acriss 2 visits with shoe lift in R shoe in order to minimize coccyx pain and progress to deep core HEP   ? Time 2   ? Period Weeks   ? Status Achieved   ? Target Date 01/19/22   ?  ? PT LONG TERM GOAL #4  ? Title Pt will demo no limp in her gait and report < 50 % pain at L ankle in order to minimzie risk of falls   ? Time 6   ? Period Weeks   ? Status Achieved   ? Target Date 02/16/22   ?  ? PT LONG TERM GOAL #5  ? Title Pt will demo proper technique for deep core in order to improve IAP system for less straining with  bowel movements   ? Baseline dyscoordination   ? Time 6   ? Period Weeks   ? Status Achieved   ? Target Date 02/16/22   ?  ? PT LONG TERM GOAL #6  ? Title Pt will demo proper body  mechanics in her ADLs and yardwork in order to minimzie worsening of prolapse   ? Time 4   ? Status Achieved   ? Target Date 02/02/22   ?  ? PT LONG TERM GOAL #7  ? Title Pt will report no longer needing to experiencing rectal spasms, straining 50% less , less difficulty with elimination, and having stool type 4 50% of the time instead of Type 2 and 6 100% of the time  ( 02/08/22: no  rectal spasms, straining 50% less, Stool Type 4  50%   ? Baseline she still feels the urge to have a toilet. Pt has strain to have a BM but still nothing comes out. Frequency of BM occurs daily or twice a day. [place] Stool Type 2, 6   ? Time 10   ? Period Weeks   ?  Status Achieved   ? Target Date 03/16/22   ?  ? PT LONG TERM GOAL #8  ? Title Pt will report  no more R groin pain and L hip pain and be able to  sit to stand after  sitting   ? Baseline pain at R groin pain and L hip pain   ? Time 8   ? Period Weeks   ? Status New   ? Target Date 05/02/22   ? ?  ?  ? ?  ? ? ? ? ? ? ? ? Plan - 03/16/22 1227   ? ? Clinical Impression Statement Pt has achieved 6 out of 8 goals and progressing towards remaining goals. Improvements include: ? ?- corrected leg length difference ( R shorter leg with shoe lift put into shoe  but pt has not been compliant the past weeks  ? ?- no longer experiencing rectal spasms, straining 50% less , less difficulty with elimination, and having stool type 4 50% of the time instead of Type 2 and 6 100% of the time  ? ?-increased gait mechanics ? ?-corrected spine and pelvic alignment ? ?-improved FOTO lumbar score indicating improved LBP ? ? ?Pt 's remaining issues include still noticing her prolapse and also to decrease  R groin pain and L hip pain and be able to  sit to stand after sitting . Pt also needs to be compliant with wearing shoe lift and performing deep core exercises consistently because pt has not been as compliant with these recommendations the past week due to caring for her husband and dtr who has medical conditions. Pt also has not been wearing her shoe lift to clinic across the past sessions and she reports not wearing it in her gardening shoes. This explains the reason she appeared with lowered R iliac crest like she did at evaluation prior to being provided shoe lift. Pt reports she has been gardening for long hours despite being advised to take breaks. Pt has been explained the importance of minimizing loading her pelvic floor and recommended pessary. Pt agreed and therapist called gynecologist office and was able to get pt scheduled end of May. Pt 's PCP was also called about her gout complaints and was scheduled afternoon of her PT visit. With pain with putting down her big toe on L foot, her gait appeared antalgic and she demo'd decreased stance phase on LLE. It is important to ensure her pelvic  girdle alignment is maintained so lower kinetic chain deficits must be maintained to help her LBP and improve prolapse.  ? ?Pt continues to benefit from skilled PT in 2 weeks after pt returns from visi

## 2022-03-22 ENCOUNTER — Ambulatory Visit: Payer: Medicare PPO | Admitting: Physical Therapy

## 2022-03-29 ENCOUNTER — Ambulatory Visit: Payer: Medicare PPO | Attending: Obstetrics and Gynecology | Admitting: Physical Therapy

## 2022-03-29 DIAGNOSIS — R2689 Other abnormalities of gait and mobility: Secondary | ICD-10-CM | POA: Insufficient documentation

## 2022-03-29 DIAGNOSIS — G8929 Other chronic pain: Secondary | ICD-10-CM | POA: Diagnosis present

## 2022-03-29 DIAGNOSIS — R278 Other lack of coordination: Secondary | ICD-10-CM | POA: Insufficient documentation

## 2022-03-29 DIAGNOSIS — M545 Low back pain, unspecified: Secondary | ICD-10-CM | POA: Insufficient documentation

## 2022-03-29 DIAGNOSIS — M533 Sacrococcygeal disorders, not elsewhere classified: Secondary | ICD-10-CM | POA: Insufficient documentation

## 2022-03-29 DIAGNOSIS — M2170 Unequal limb length (acquired), unspecified site: Secondary | ICD-10-CM | POA: Diagnosis present

## 2022-03-29 DIAGNOSIS — M25572 Pain in left ankle and joints of left foot: Secondary | ICD-10-CM | POA: Insufficient documentation

## 2022-03-29 NOTE — Therapy (Signed)
[facility] ?[place], [place], [address] ?Phone: [phone]   Fax:  [phone] ? ?Physical Therapy Treatment/ Discharge Summary across 11 visits  ? ?Patient Details  ?Name: Jasmine Welch ?MRN: [id number] ?Date of Birth: [birth year] ?Referring Provider (PT): Leafy Ro MD ? ? ?Encounter Date: 03/29/2022 ? ? PT End of Session - 03/29/22 0948   ? ? Visit Number 11   ? Date for PT Re-Evaluation 05/25/22   ? PT Start Time [phone]   ? PT Stop Time [phone]   ? PT Time Calculation (min) 55 min   ? Activity Tolerance Patient tolerated treatment well   ? Behavior During Therapy [facility] for tasks assessed/performed   ? ?  ?  ? ?  ? ? ?Past Medical History:  ?Diagnosis Date  ? Anemia   ? Diabetes mellitus without complication ([place])   ? Fatty liver   ? Gastric ulcer due to Helicobacter pylori   ? GERD (gastroesophageal reflux disease)   ? History of esophageal stricture   ? Hyperlipidemia   ? Hyperplastic colon polyp   ? Hypertension   ? Right ankle pain   ? ? ?Past Surgical History:  ?Procedure Laterality Date  ? ABDOMINAL HYSTERECTOMY    ? carpel tunnel    ? CHOLECYSTECTOMY    ? COLONOSCOPY N/A 05/25/2015  ? Procedure: COLONOSCOPY;  Surgeon: Lollie Sails, MD;  Location: [facility] ENDOSCOPY;  Service: Endoscopy;  Laterality: N/A;  ? ESOPHAGOGASTRODUODENOSCOPY N/A 12/13/2018  ? Procedure: ESOPHAGOGASTRODUODENOSCOPY (EGD);  Surgeon: Lollie Sails, MD;  Location: [facility] ENDOSCOPY;  Service: Endoscopy;  Laterality: N/A;  ? mamoplastic reduction    ? REDUCTION MAMMAPLASTY Bilateral 1973  ? TONSILLECTOMY    ? TUBAL LIGATION    ? ? ?There were no vitals filed for this visit. ? ? Subjective Assessment - 03/29/22 1431   ? ? Subjective Pt reported she saw her PCP and her big toe pain went again and in the past, it comes and goes. Pt reported xrays showed no Fx and gout was r/o.   ? 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 Patient Stated Goals see improvement with pelvic organs in proper position and not see it when lifting her leg   ? ?  ?  ? ?  ? ? ? ? ? [facility] PT Assessment - 03/29/22 1428   ? ?  ? Coordination  ? Coordination and Movement Description reviewed deep core   ?  ? Floor to Stand  ? Comments required cues for pelvic stability technique   ?  ? Palpation  ? Palpation comment levelled pelvic girdle with shoe lifts   ? ?  ?  ? ?  ? ? ? ? ? ? ? ? ? ? ? ? ? ? ? ? [facility] Adult PT Treatment/Exercise - 03/29/22 1429   ? ?  ? Therapeutic Activites   ? Other Therapeutic Activities guided pt through floor to stand t/f   assessed pt's shoe lifts from home and explained importance of replacing shoe lift after a few months to maintain equal foot alignment  ?  ? Neuro Re-ed   ? Neuro Re-ed Details  cued for alignment and tehcnique with deep core and body mechanics   ? ?  ?  ? ?  ? ? ? ? ? ? ? ? ? ? ? ? ? ? ?  PT Long Term Goals - 03/29/22 0948   ? ?  ? PT LONG TERM GOAL #1  ? Title Pt will improve her FOTO lumbar score by > 5 pts in order to imrpove QOL and ADLs   ? Baseline 56 pts  ( 02/15/22 : 67 pts)   ? Time 10   ? Period Weeks   ? Status Achieved   ? Target Date 03/16/22   ?  ? PT LONG TERM GOAL #2  ? Title Pt will lower her score on FOTO prolapse from 33pts   , Bowel Problem 25 pts by more than 5 pts in order to restore pelvic function  ( 03/29/22: 29 pts change in pts for Prolapse,  Browel 25 pts change )   ? Baseline 33pts -->4pts prolapse,  25 pts --> 0 pts bowel at evel to discharge   ? Time 10   ? Period Weeks   ? Status Achieved   ? Target Date 03/16/22   ?  ? PT LONG TERM GOAL #3  ? Title Pt will demo equal pelvic girdle and shoulder height acriss 2 visits with shoe lift in R shoe in order to minimize coccyx pain and progress to deep core HEP   ? Time 2   ? Period Weeks   ? Status Achieved   ?  Target Date 01/19/22   ?  ? PT LONG TERM GOAL #4  ? Title Pt will demo no limp in her gait and report < 50 % pain at L ankle in order to minimzie risk of falls   ? Time 6   ? Period Weeks   ? Status Achieved   ? Target Date 02/16/22   ?  ? PT LONG TERM GOAL #5  ? Title Pt will demo proper technique for deep core in order to improve IAP system for less straining with  bowel movements   ? Baseline dyscoordination   ? Time 6   ? Period Weeks   ? Status Achieved   ? Target Date 02/16/22   ?  ? PT LONG TERM GOAL #6  ? Title Pt will demo proper body  mechanics in her ADLs and yardwork in order to minimzie worsening of prolapse   ? Time 4   ? Status Achieved   ? Target Date 02/02/22   ?  ? PT LONG TERM GOAL #7  ? Title Pt will report no longer needing to experiencing rectal spasms, straining 50% less , less difficulty with elimination, and having stool type 4 50% of the time instead of Type 2 and 6 100% of the time  ( 02/08/22: no  rectal spasms, straining 50% less, Stool Type 4  50%   ? Baseline she still feels the urge to have a toilet. Pt has strain to have a BM but still nothing comes out. Frequency of BM occurs daily or twice a day. [place] Stool Type 2, 6   ? Time 10   ? Period Weeks   ? Status Achieved   ? Target Date 03/16/22   ?  ? PT LONG TERM GOAL #8  ? Title Pt will report  no more R groin pain and L hip pain and be able to  sit to stand after sitting in a car seat. desk chair   ? Baseline pain at R groin pain and L hip pain   ? Time 8   ? Period Weeks   ? Status Deferred   ? Target Date 05/02/22   ? ?  ?  ? ?  ? ? ? ? ? ? ? ?  Plan - 03/29/22 1432   ? ? Clinical Impression Statement Patient has achieved 100% of her 7 goals.  Patient's Foto scores for lumbar pain and bowel problem and prolapse have improved significantly.  Patient no longer has a limp in her gait because her lower right leg has been adjusted with a shoe lift which also realigned her pelvic girdle and spine.  Patient's improved alignment has  helped improve her IAP system to help with better coordination of the pelvic floor to improve prolapse symptoms and low back pain symptoms.  Patient is independent with deep core exercises to help maintain her improvements.  Patient is independent with body mechanics to minimize worsening of prolapse.  Patient is ready for discharge at this time.  ? Personal Factors and Comorbidities Comorbidity 3+;Profession   ? Examination-Activity Limitations Stand;Toileting;Continence;Locomotion Level;Carry;Squat;Lift   ? Stability/Clinical Decision Making Evolving/Moderate complexity   ? Rehab Potential Good   ? PT Frequency 1x / week   ? PT Duration Other (comment)   10  ? PT Treatment/Interventions Canalith Repostioning;Therapeutic exercise;Balance training;Therapeutic activities;Patient/family education;Functional mobility training;Stair training;Neuromuscular re-education;Moist Heat;Traction;Scar mobilization;Manual techniques;Energy conservation;Splinting;Joint Manipulations;Spinal Manipulations;Taping;Manual lymph drainage;ADLs/Self Care Home Management;Gait training   ? Consulted and Agree with Plan of Care Patient   ? ?  ?  ? ?  ? ? ?Patient will benefit from skilled therapeutic intervention in order to improve the following deficits and impairments:  Decreased balance, Decreased activity tolerance, Decreased skin integrity, Decreased mobility, Decreased strength, Decreased scar mobility, Decreased endurance, Decreased coordination, Decreased range of motion, Increased muscle spasms, Hypomobility, Improper body mechanics, Impaired sensation, Difficulty walking ? ?Visit Diagnosis: ?Unequal leg length ? ?Acute left ankle pain ? ?Other lack of coordination ? ?Other abnormalities of gait and mobility ? ?Chronic bilateral low back pain without sciatica ? ?Sacrococcygeal disorders, not elsewhere classified ? ? ? ? ?Problem List ?There are no problems to display for this patient. ? ? ?Jerl Mina, PT ?03/29/2022, 2:33  PM ? ?[place] [facility] ?[place], [place], [address] ?Phone: [phone]   Fax:  [phone] ? ?Name: MICHAELE AMUNDSON ?MRN: [id number] ?Date of B

## 2022-04-07 ENCOUNTER — Ambulatory Visit: Payer: Medicare PPO | Admitting: Physical Therapy

## 2022-04-12 ENCOUNTER — Encounter: Payer: Medicare PPO | Admitting: Physical Therapy

## 2022-04-19 ENCOUNTER — Encounter: Payer: Medicare PPO | Admitting: Physical Therapy

## 2022-04-26 ENCOUNTER — Encounter: Payer: Medicare PPO | Admitting: Physical Therapy

## 2022-05-02 ENCOUNTER — Encounter: Payer: Medicare PPO | Admitting: Physical Therapy

## 2022-05-16 ENCOUNTER — Encounter: Payer: Medicare PPO | Admitting: Physical Therapy

## 2022-05-23 ENCOUNTER — Encounter: Payer: Medicare PPO | Admitting: Physical Therapy

## 2022-05-31 ENCOUNTER — Encounter: Payer: Medicare PPO | Admitting: Physical Therapy

## 2022-06-02 ENCOUNTER — Emergency Department
Admission: EM | Admit: 2022-06-02 | Discharge: 2022-06-02 | Disposition: A | Discharge status: Home / Self Care | Payer: Medicare PPO | Attending: Emergency Medicine | Admitting: Emergency Medicine

## 2022-06-02 ENCOUNTER — Emergency Department: Payer: Medicare PPO

## 2022-06-02 ENCOUNTER — Other Ambulatory Visit: Payer: Self-pay

## 2022-06-02 ENCOUNTER — Encounter: Payer: Self-pay | Admitting: Emergency Medicine

## 2022-06-02 DIAGNOSIS — R220 Localized swelling, mass and lump, head: Secondary | ICD-10-CM | POA: Diagnosis not present

## 2022-06-02 DIAGNOSIS — R000 Tachycardia, unspecified: Secondary | ICD-10-CM | POA: Insufficient documentation

## 2022-06-02 DIAGNOSIS — E876 Hypokalemia: Secondary | ICD-10-CM | POA: Diagnosis not present

## 2022-06-02 DIAGNOSIS — I10 Essential (primary) hypertension: Secondary | ICD-10-CM | POA: Diagnosis present

## 2022-06-02 DIAGNOSIS — E119 Type 2 diabetes mellitus without complications: Secondary | ICD-10-CM | POA: Insufficient documentation

## 2022-06-02 LAB — CBC
HCT: 38.3 % (ref 36.0–46.0)
Hemoglobin: 12.8 g/dL (ref 12.0–15.0)
MCH: 31.9 pg (ref 26.0–34.0)
MCHC: 33.4 g/dL (ref 30.0–36.0)
MCV: 95.5 fL (ref 80.0–100.0)
Platelets: 233 10*3/uL (ref 150–400)
RBC: 4.01 MIL/uL (ref 3.87–5.11)
RDW: 13.4 % (ref 11.5–15.5)
WBC: 7.2 10*3/uL (ref 4.0–10.5)
nRBC: 0 % (ref 0.0–0.2)

## 2022-06-02 LAB — BASIC METABOLIC PANEL
Anion gap: 11 (ref 5–15)
BUN: 21 mg/dL (ref 8–23)
CO2: 29 mmol/L (ref 22–32)
Calcium: 9.7 mg/dL (ref 8.9–10.3)
Chloride: 99 mmol/L (ref 98–111)
Creatinine, Ser: 0.97 mg/dL (ref 0.44–1.00)
GFR, Estimated: >60 mL/min (ref >60)
Glucose, Bld: 150 mg/dL — ABNORMAL HIGH (ref 70–99)
Potassium: 3.2 mmol/L — ABNORMAL LOW (ref 3.5–5.1)
Sodium: 139 mmol/L (ref 135–145)

## 2022-06-02 LAB — TROPONIN I (HIGH SENSITIVITY): Troponin I (High Sensitivity): 5 ng/L (ref <18)

## 2022-06-02 MED ORDER — TRIAMTERENE-HCTZ 37.5-25 MG PO TABS
1.0000 | ORAL_TABLET | ORAL | Status: AC
  Administered 2022-06-02: 1 via ORAL
  Filled 2022-06-02: qty 1

## 2022-06-02 MED ORDER — POTASSIUM CHLORIDE CRYS ER 20 MEQ PO TBCR
40.0000 meq | EXTENDED_RELEASE_TABLET | Freq: Once | ORAL | Status: AC
  Administered 2022-06-02: 40 meq via ORAL
  Filled 2022-06-02: qty 2

## 2022-06-02 MED ORDER — AMLODIPINE BESYLATE 5 MG PO TABS
10.0000 mg | ORAL_TABLET | ORAL | Status: AC
  Administered 2022-06-02: 10 mg via ORAL
  Filled 2022-06-02: qty 2

## 2022-06-02 MED ORDER — METOPROLOL SUCCINATE ER 50 MG PO TB24
50.0000 mg | ORAL_TABLET | ORAL | Status: AC
  Administered 2022-06-02: 50 mg via ORAL
  Filled 2022-06-02: qty 1

## 2022-06-02 NOTE — ED Provider Triage Note (Signed)
Emergency Medicine Provider Triage Evaluation Note  Jasmine Welch, a 76 y.o. female  was evaluated in triage.  Pt complains of swelling and fullness to the cheek and jaw region while eating and aunty process tablet.  She also reports scattered nonpruritic macular rash.  Patient does report removing a tick from her right shoulder few days ago but denies that the tick was engorged patient denies any associated fevers, chills, sweats.  Patient presents to the ED from local urgent care, where she was noted to have elevated blood pressure and pulse readings.  Review of Systems  Positive: Left facial swelling and pain, nonpruritic rash, HTN Negative: FCS, CP, SOB  Physical Exam  BP (!) 193/120   Pulse (!) 117   Temp 98 F (36.7 C) (Oral)   Resp 18   Ht 5' 1.5" (1.562 m)   Wt 71.2 kg   SpO2 99%   BMI 29.18 kg/m  Gen:   Awake, no distress   Resp:  Normal effort  MSK:   Moves extremities without difficulty  Other:    Medical Decision Making  Medically screening exam initiated at 5:27 PM.  Appropriate orders placed.  Jasmine Welch was informed that the remainder of the evaluation will be completed by another provider, this initial triage assessment does not replace that evaluation, and the importance of remaining in the ED until their evaluation is complete.  Geriatric patient to the ED for evaluation of some sudden left facial swelling while eating.  She also reports some elevated blood pressures   Sheikh Leverich, Dannielle Karvonen, PA-C 06/02/22 1728

## 2022-06-02 NOTE — ED Provider Notes (Addendum)
[facility] Provider Note    Event Date/Time   First MD Initiated Contact with Patient 06/02/22 Bosie Helper     (approximate)   History   Hypertension   HPI  Jasmine Welch is a 76 y.o. female with a past medical history of HTN, HDL, GERD, gastric ulcer, fatty liver, DM and anemia who presents referred from urgent care for evaluation of some left-sided facial symptoms she experienced earlier today as well as high blood pressure.  She states she has not had her blood pressure medicine today she usually takes her blood pressure medications all at once in the evening.  She states she had gone to urgent care after she developed some left-sided facial swelling around lunchtime when she was eating some and he passed out.  She states the swelling has come down.  She denies any pain, headache, earache, sore throat, shortness of breath, cough, chest pain, back pain, nausea, vomiting, diarrhea rash or any extremity pain weakness numbness or paresthesias.  Denies any vision changes.  No recent traumas.  She is not aware of any food allergies.  She does note she has many bug bites and that her husband pulled a tick off her left shoulder couple weeks ago.  She has been taking Benadryl and Zyrtec which seems to help with itching a little bit.  No prior similar episodes.  No other acute concerns at this time.    Past Medical History:  Diagnosis Date   Anemia    Diabetes mellitus without complication ([place])    Fatty liver    Gastric ulcer due to Helicobacter pylori    GERD (gastroesophageal reflux disease)    History of esophageal stricture    Hyperlipidemia    Hyperplastic colon polyp    Hypertension    Right ankle pain      Physical Exam  Triage Vital Signs: ED Triage Vitals  Enc Vitals Group     BP 06/02/22 1713 (!) 193/120     Pulse Rate 06/02/22 1713 (!) 117     Resp 06/02/22 1713 18     Temp 06/02/22 1713 98 F (36.7 C)     Temp Source 06/02/22 1713 Oral      SpO2 06/02/22 1713 99 %     Weight 06/02/22 1715 157 lb (71.2 kg)     Height 06/02/22 1715 5' 1.5" (1.562 m)     Head Circumference --      Peak Flow --      Pain Score 06/02/22 1715 0     Pain Loc --      Pain Edu? --      Excl. in [place]? --     Most recent vital signs: Vitals:   06/02/22 2000 06/02/22 2015  BP: (!) 143/83   Pulse: 86 80  Resp: 20 13  Temp:    SpO2: 98% 100%    General: Awake, no distress.  CV:  Good peripheral perfusion.  2+ radial pulse.  No murmur. Resp:  Normal effort.  Clear bilaterally. Abd:  No distention.  Soft Other:  TMs and oropharynx unremarkable.  There is no significant edema tenderness erythema induration pustules or lesions over the face.   Cranial nerves II through XII grossly intact.  No pronator drift.  No finger dysmetria.  Symmetric 5/5 strength of all extremities.  Sensation intact to light touch in all extremities.  Unremarkable unassisted gait.    ED Results / Procedures / Treatments  Labs (all labs ordered are  listed, but only abnormal results are displayed) Labs Reviewed  BASIC METABOLIC PANEL - Abnormal; Notable for the following components:      Result Value   Potassium 3.2 (*)    Glucose, Bld 150 (*)    All other components within normal limits  CBC  TROPONIN I (HIGH SENSITIVITY)     EKG  EKG is remarkable sinus tachycardia with a ventricular rate of 111, normal axis, unremarkable intervals without clear evidence of acute ischemia or significant arrhythmia.   RADIOLOGY   Chest reviewed by myself shows no focal consoidation, effusion, edema, pneumothorax or other clear acute thoracic process. I also reviewed radiology interpretation and agree with findings described.  CT head on my interpretation without evidence of ischemia, edema, hemorrhage, mass effect or other clear acute process.  I also reviewed radiology's interpretation and agree to findings of minimal chronic, vessel ischemic changes without other acute  process.  PROCEDURES:  Critical Care performed: No  Procedures   MEDICATIONS ORDERED IN ED: Medications  potassium chloride SA (KLOR-CON M) CR tablet 40 mEq (40 mEq Oral Given 06/02/22 1909)  amLODipine (NORVASC) tablet 10 mg (10 mg Oral Given 06/02/22 1909)  metoprolol succinate (TOPROL-XL) 24 hr tablet 50 mg (50 mg Oral Given 06/02/22 1909)  triamterene-hydrochlorothiazide (MAXZIDE-25) 37.5-25 MG per tablet 1 tablet (1 tablet Oral Given 06/02/22 2001)     IMPRESSION / MDM / ASSESSMENT AND PLAN / ED COURSE  I reviewed the triage vital signs and the nursing notes. Patient's presentation is most consistent with acute presentation with potential threat to life or bodily function.                               Differential diagnosis includes, but is not limited to allergic reaction/question angioedema although patient is not on any ACE, cellulitis, sialoadenitis, TMJ dysfunction.  No history exam features to suggest a CVA.  Patient is markedly hypertensive on arrival with a pressure of 193/120 but this improved after she received her blood pressure medicines to 162/91.  She is denying any other sick symptoms and states she feels that her face is now symmetric and there is no evidence of asymmetry edema erythema or other leg skin changes this examiner.  I cannot appreciate any evidence of otitis media or other deep space infection of the head or neck.  EKG is remarkable sinus tachycardia with a ventricular rate of 111, normal axis, unremarkable intervals without clear evidence of acute ischemia or significant arrhythmia.  Nonelevated troponin not suggestive of ACS  Chest reviewed by myself shows no focal consoidation, effusion, edema, pneumothorax or other clear acute thoracic process. I also reviewed radiology interpretation and agree with findings described.  CT head on my interpretation without evidence of ischemia, edema, hemorrhage, mass effect or other clear acute process.  I also reviewed  radiology's interpretation and agree to findings of minimal chronic, vessel ischemic changes without other acute process.  BMP shows a K of 3.2 without any other significant derangements.  CBC without leukocytosis or acute anemia.   Discharged in stable condition.  Instructed to follow-up with PCP.     FINAL CLINICAL IMPRESSION(S) / ED DIAGNOSES   Final diagnoses:  Hypertension, unspecified type  Facial swelling  Hypokalemia     Rx / DC Orders   ED Discharge Orders     None        Note:  This document was prepared using Dragon voice recognition software  and may include unintentional dictation errors.   Lucrezia Starch, MD 06/02/22 2016    Lucrezia Starch, MD 06/02/22 2017

## 2022-06-02 NOTE — ED Triage Notes (Signed)
Pt states she was eating lunch when she could feel some facial swelling to left side of face. Pt sent from clinic due to HTN. Pt has not taken BP meds yet today. Denies any CP or SOB. Pt states the left side of her face feels different than normal. Face is symmetric and equal grip strengths.

## 2022-06-03 ENCOUNTER — Emergency Department: Payer: Medicare PPO

## 2022-06-03 ENCOUNTER — Other Ambulatory Visit: Payer: Self-pay

## 2022-06-03 ENCOUNTER — Emergency Department
Admission: EM | Admit: 2022-06-03 | Discharge: 2022-06-03 | Disposition: A | Discharge status: Home / Self Care | Payer: Medicare PPO | Attending: Emergency Medicine | Admitting: Emergency Medicine

## 2022-06-03 DIAGNOSIS — R220 Localized swelling, mass and lump, head: Secondary | ICD-10-CM | POA: Diagnosis not present

## 2022-06-03 LAB — CBC WITH DIFFERENTIAL/PLATELET
Abs Immature Granulocytes: 0.03 10*3/uL (ref 0.00–0.07)
Basophils Absolute: 0 10*3/uL (ref 0.0–0.1)
Basophils Relative: 1 %
Eosinophils Absolute: 0.2 10*3/uL (ref 0.0–0.5)
Eosinophils Relative: 3 %
HCT: 40.8 % (ref 36.0–46.0)
Hemoglobin: 13.4 g/dL (ref 12.0–15.0)
Immature Granulocytes: 0 %
Lymphocytes Relative: 33 %
Lymphs Abs: 2.4 10*3/uL (ref 0.7–4.0)
MCH: 31 pg (ref 26.0–34.0)
MCHC: 32.8 g/dL (ref 30.0–36.0)
MCV: 94.4 fL (ref 80.0–100.0)
Monocytes Absolute: 0.5 10*3/uL (ref 0.1–1.0)
Monocytes Relative: 8 %
Neutro Abs: 4 10*3/uL (ref 1.7–7.7)
Neutrophils Relative %: 55 %
Platelets: 244 10*3/uL (ref 150–400)
RBC: 4.32 MIL/uL (ref 3.87–5.11)
RDW: 13.5 % (ref 11.5–15.5)
WBC: 7.2 10*3/uL (ref 4.0–10.5)
nRBC: 0 % (ref 0.0–0.2)

## 2022-06-03 LAB — BASIC METABOLIC PANEL
Anion gap: 15 (ref 5–15)
BUN: 19 mg/dL (ref 8–23)
CO2: 24 mmol/L (ref 22–32)
Calcium: 10.1 mg/dL (ref 8.9–10.3)
Chloride: 100 mmol/L (ref 98–111)
Creatinine, Ser: 1 mg/dL (ref 0.44–1.00)
GFR, Estimated: 59 mL/min — ABNORMAL LOW (ref >60)
Glucose, Bld: 115 mg/dL — ABNORMAL HIGH (ref 70–99)
Potassium: 4.3 mmol/L (ref 3.5–5.1)
Sodium: 139 mmol/L (ref 135–145)

## 2022-06-03 NOTE — ED Notes (Signed)
Attempted IV stick and lab draw. Got flashback of blood, unable to advance catheter. Second RN at bedside to attempt blood draw.

## 2022-06-03 NOTE — Discharge Instructions (Signed)
Follow up with PCP.  Return to the ER for any additional questions or concerns.

## 2022-06-03 NOTE — ED Provider Notes (Signed)
[facility] Provider Note    Event Date/Time   First MD Initiated Contact with Patient 06/03/22 1312     (approximate)   History   Facial Swelling   HPI  Jasmine Welch is a 76 y.o. female presents to the ED with complaint of left-sided facial swelling.  Patient states that she was seen yesterday for the same sensation that she got while eating a salad.  She states that because her blood pressure was elevated yesterday she had multiple test and also given medication to bring her blood pressure down.  Patient states that today she was eating the same salad when she felt that the left side of her face was swelling and now feels tight like there is air under the skin.  Patient denies any shortness of breath or difficulty breathing, swallowing or talking.      Physical Exam   Triage Vital Signs: ED Triage Vitals [06/03/22 1302]  Enc Vitals Group     BP (!) 174/90     Pulse Rate 94     Resp 17     Temp 97.9 F (36.6 C)     Temp Source Oral     SpO2 100 %     Weight      Height      Head Circumference      Peak Flow      Pain Score      Pain Loc      Pain Edu?      Excl. in [place]?     Most recent vital signs: Vitals:   06/03/22 1601 06/03/22 1739  BP: 123/68 118/74  Pulse: 70 74  Resp: 18 17  Temp:  98.2 F (36.8 C)  SpO2: 97% 98%     General: Awake, no distress.  Talkative and pleasant. CV:  Good peripheral perfusion.  Regular rate and rhythm. Resp:  Normal effort.  Lungs are clear bilaterally. Abd:  No distention.  Other:  Cranial nerves II through XII grossly intact.  Speech is normal.  Oral pharynx unremarkable and no dental abscess or edema of the gums noted.  No lesions in the buccal mucosa.  No tenderness on palpation of the left facial area, lesions.  EACs are dull without erythema or injection and canals are clear bilaterally.  Neck is supple without cervical lymphadenopathy.   ED Results / Procedures / Treatments    Labs (all labs ordered are listed, but only abnormal results are displayed) Labs Reviewed  BASIC METABOLIC PANEL - Abnormal; Notable for the following components:      Result Value   Glucose, Bld 115 (*)    GFR, Estimated 59 (*)    All other components within normal limits  CBC WITH DIFFERENTIAL/PLATELET      RADIOLOGY Pending MRI    PROCEDURES:  Critical Care performed:   Procedures   MEDICATIONS ORDERED IN ED: Medications - No data to display   IMPRESSION / MDM / [place] / ED COURSE  I reviewed the triage vital signs and the nursing notes.   Differential diagnosis includes, but is not limited to, hypertension, paresthesias left facial area, CVA.  76 year old female presents to the ED with complaint of left-sided facial swelling after eating a salad.  Patient was seen yesterday for the same complaint however at the time of she was being seen her blood pressure was elevated.  A CT scan was included in her work-up yesterday.  Patient decided to eat the same  salad today and began having similar symptoms as yesterday but denies any difficulty breathing or swallowing.  Physical exam was benign.  I asked Dr. Quentin Cornwall who currently is my attending to see the patient as well as she is a bounce back less than 24 hours.  He agreed that there was no physical findings to explain patient's symptoms.  Basic labs were ordered and an MRI of her brain for further evaluation.  Patient is aware and denies the need for sedation prior to her MRI.      Patient's presentation is most consistent with acute complicated illness / injury requiring diagnostic workup.  FINAL CLINICAL IMPRESSION(S) / ED DIAGNOSES   Final diagnoses:  Left facial swelling     Rx / DC Orders   ED Discharge Orders     None        Note:  This document was prepared using Dragon voice recognition software and may include unintentional dictation errors.   Jasmine Hai, PA-C 06/04/22  1044    Jasmine Lot, MD 06/04/22 [phone]

## 2022-06-03 NOTE — ED Provider Notes (Signed)
Patient seen with [name].  Somewhat of an odd presentation describing left facial fullness and tingling on exam has no facial droop does not have any swelling or angioedema.  Patient had extensive evaluation of the ER yesterday for similar symptoms noted to be hypertensive.  Concern for possible CVA or TIA but no other deficits identified.  Given duration with recent CT imaging yesterday being negative MRI brain ordered to further evaluate.  Her MRI is without acute abnormality not consistent with CVA.  She does not have any objective deficits.  Sensation is intact throughout.  At this point believe she is appropriate for outpatient follow-up.   [name], MD 06/03/22 1725

## 2022-06-03 NOTE — ED Triage Notes (Signed)
Patient to ER via POV, reports that she was seen yesterday for some left sided facial swelling after eating a salad. States that she ate the same salad again today and felt like the swelling was returning, reports that it feels tight and like she "can feel air" by the side of her ear. Denies SHOB/ CP.

## 2022-06-06 ENCOUNTER — Encounter: Payer: Medicare PPO | Admitting: Physical Therapy

## 2023-03-08 ENCOUNTER — Other Ambulatory Visit: Payer: Self-pay | Admitting: Family Medicine

## 2023-03-08 DIAGNOSIS — Z1231 Encounter for screening mammogram for malignant neoplasm of breast: Secondary | ICD-10-CM

## 2023-04-03 ENCOUNTER — Ambulatory Visit
Admission: RE | Admit: 2023-04-03 | Discharge: 2023-04-03 | Disposition: A | Discharge status: Home / Self Care | Payer: Medicare PPO | Source: Ambulatory Visit | Attending: Family Medicine | Admitting: Family Medicine

## 2023-04-03 DIAGNOSIS — Z1231 Encounter for screening mammogram for malignant neoplasm of breast: Secondary | ICD-10-CM | POA: Insufficient documentation

## 2024-03-21 ENCOUNTER — Other Ambulatory Visit: Payer: Self-pay | Admitting: Family Medicine

## 2024-03-21 DIAGNOSIS — Z1231 Encounter for screening mammogram for malignant neoplasm of breast: Secondary | ICD-10-CM

## 2024-04-04 ENCOUNTER — Ambulatory Visit
Admission: RE | Admit: 2024-04-04 | Discharge: 2024-04-04 | Disposition: A | Discharge status: Home / Self Care | Source: Ambulatory Visit | Attending: Family Medicine | Admitting: Family Medicine

## 2024-04-04 DIAGNOSIS — Z1231 Encounter for screening mammogram for malignant neoplasm of breast: Secondary | ICD-10-CM | POA: Diagnosis present

## 2024-07-10 ENCOUNTER — Other Ambulatory Visit (HOSPITAL_COMMUNITY)
Admission: RE | Admit: 2024-07-10 | Discharge: 2024-07-10 | Disposition: A | Discharge status: Home / Self Care | Attending: Obstetrics and Gynecology | Admitting: Obstetrics and Gynecology

## 2024-07-10 ENCOUNTER — Ambulatory Visit: Admitting: Obstetrics and Gynecology

## 2024-07-10 ENCOUNTER — Encounter: Payer: Self-pay | Admitting: Obstetrics and Gynecology

## 2024-07-10 VITALS — BP 167/90 | HR 74 | Ht 60.5 in | Wt 156.6 lb

## 2024-07-10 DIAGNOSIS — R82998 Other abnormal findings in urine: Secondary | ICD-10-CM | POA: Diagnosis present

## 2024-07-10 DIAGNOSIS — R350 Frequency of micturition: Secondary | ICD-10-CM

## 2024-07-10 DIAGNOSIS — N993 Prolapse of vaginal vault after hysterectomy: Secondary | ICD-10-CM | POA: Diagnosis not present

## 2024-07-10 LAB — POCT URINALYSIS DIP (CLINITEK)
Bilirubin, UA: NEGATIVE
Blood, UA: NEGATIVE
Glucose, UA: NEGATIVE mg/dL
Ketones, POC UA: NEGATIVE mg/dL
Nitrite, UA: NEGATIVE
POC PROTEIN,UA: NEGATIVE
Spec Grav, UA: 1.02 (ref 1.010–1.025)
Urobilinogen, UA: 0.2 U/dL
pH, UA: 5.5 (ref 5.0–8.0)

## 2024-07-10 NOTE — Assessment & Plan Note (Addendum)
-   Stage II anterior, Stage II posterior, Stage I apical prolapse - For treatment of pelvic organ prolapse, we discussed options for management including expectant management, conservative management, and surgical management, such as Kegels, a pessary, pelvic floor physical therapy, and specific surgical procedures. - She is not interested in trying a pessary again - We discussed two options for prolapse repair:  1) vaginal repair without mesh - Pros - safer, no mesh complications - Cons - not as strong as mesh repair, higher risk of recurrence  2) laparoscopic repair with mesh - Pros - stronger, better long-term success - Cons - risks of mesh implant (erosion into vagina or bladder, adhering to the rectum, pain) - these risks are lower than with a vaginal mesh but still exist - Handouts provided on surgical options but she prefers to expectantly manage at this time. Advised her to avoid heavy lifting or straining as this can exacerbate symptoms. Will have her return in a year to reassess symptoms and desire for surgery. If she has symptoms of incomplete bladder emptying then she should return sooner for evaluation.

## 2024-07-10 NOTE — Patient Instructions (Signed)
You have a stage 2 (out of 4) prolapse.  We discussed the fact that it is not life threatening but there are several treatment options. For treatment of pelvic organ prolapse, we discussed options for management including expectant management, conservative management, and surgical management, such as Kegels, a pessary, pelvic floor physical therapy, and specific surgical procedures.

## 2024-07-10 NOTE — Progress Notes (Signed)
 New Patient Evaluation and Consultation  Referring Provider: Storm Setter, DO PCP: Glover Lenis, MD Date of Service: 07/10/2024  SUBJECTIVE Chief Complaint: New Patient (Initial Visit) MYLES MALLICOAT is a 78 y.o. female here today for female organ prolapse.)  History of Present Illness: LANIA ZAWISTOWSKI is a 78 y.o. Black or African-American female seen in consultation at the request of Dr Setter Storm for evaluation of prolapse.    Urinary Symptoms: Does not leak urine.   Day time voids 2-3.  Nocturia: 0 times per night to void. Voiding dysfunction:  empties bladder well.  Patient does not use a catheter to empty bladder.  When urinating, patient feels a weak stream  UTIs: 0 UTI's in the last year.   Reports history of kidney or bladder stones   Pelvic Organ Prolapse Symptoms:                  Patient Admits to a feeling of a bulge the vaginal area. It has been present for 3 years.  Patient Admits to seeing a bulge.  This bulge is bothersome. Has tried pelvic PT at [facility] and several pessaries (#3 and #4 RWS and 3.25in gellhorn)  Bowel Symptom: Bowel movements: 1 time(s) per day Stool consistency: soft  Straining: no.  Splinting: no.  Incomplete evacuation: no.  Patient Denies accidental bowel leakage / fecal incontinence Bowel regimen: none  Sexual Function Sexually active: no.  Sexual orientation: Straight   Pelvic Pain Denies pelvic pain  Past Medical History:  Past Medical History:  Diagnosis Date   Anemia    Diabetes mellitus without complication (HCC)    Fatty liver    Gastric ulcer due to Helicobacter pylori    GERD (gastroesophageal reflux disease)    History of esophageal stricture    Hyperlipidemia    Hyperplastic colon polyp    Hypertension    Right ankle pain    Vitreous floaters of right eye    A1c in April 2025- 6.5%  Past Surgical History:   Past Surgical History:  Procedure Laterality Date   ABDOMINAL HYSTERECTOMY   06/01/1981   APPENDECTOMY     carpel tunnel     CHOLECYSTECTOMY     COLONOSCOPY N/A 05/25/2015   Procedure: COLONOSCOPY;  Surgeon: Gladis RAYMOND Mariner, MD;  Location: [facility] ENDOSCOPY;  Service: Endoscopy;  Laterality: N/A;   ESOPHAGOGASTRODUODENOSCOPY N/A 12/13/2018   Procedure: ESOPHAGOGASTRODUODENOSCOPY (EGD);  Surgeon: Mariner Gladis RAYMOND, MD;  Location: [facility] ENDOSCOPY;  Service: Endoscopy;  Laterality: N/A;   mamoplastic reduction     REDUCTION MAMMAPLASTY Bilateral 1973   TONSILLECTOMY     TUBAL LIGATION       Past OB/GYN History: OB History  Gravida Para Term Preterm AB Living  2 2 2   2   SAB IAB Ectopic Multiple Live Births      2    # Outcome Date GA Lbr Len/2nd Weight Sex Type Anes PTL Lv  2 Term     F Vag-Spont   LIV  1 Term     M Vag-Spont   LIV    S/p hysterectomy  Medications: Patient has a current medication list which includes the following prescription(s): allopurinol, amlodipine , atorvastatin, cetirizine, cholecalciferol, dicyclomine, metformin, metoprolol  succinate, pantoprazole , trandolapril, triamterene -hydrochlorothiazide , cyanocobalamin, omeprazole, and simvastatin.   Allergies: Patient is allergic to augmentin [amoxicillin-pot clavulanate] and benzodiazepines.   Social History:  Social History   Tobacco Use   Smoking status: Never   Smokeless tobacco: Never  Vaping Use   Vaping status:  Never Used  Substance Use Topics   Alcohol use: No   Drug use: No    Relationship status: married Patient lives with her spouse.   Patient is not employed. Regular exercise: No History of abuse: No  Family History:   Family History  Problem Relation Age of Onset   Breast cancer Cousin 90   Breast cancer Cousin 40   Bladder Cancer Neg Hx    Uterine cancer Neg Hx    Renal cancer Neg Hx      Review of Systems: Review of Systems  Constitutional:  Negative for fever, malaise/fatigue and weight loss.  Respiratory:  Positive for wheezing. Negative for cough  and shortness of breath.   Cardiovascular:  Negative for chest pain, palpitations and leg swelling.  Gastrointestinal:  Negative for abdominal pain and blood in stool.  Genitourinary:  Negative for dysuria.       + vaginal discharge  Musculoskeletal:  Negative for myalgias.  Skin:  Negative for rash.  Neurological:  Negative for dizziness and headaches.  Endo/Heme/Allergies:  Bruises/bleeds easily.  Psychiatric/Behavioral:  Negative for depression. The patient is not nervous/anxious.      OBJECTIVE Physical Exam: Vitals:   07/10/24 1037  BP: (!) 167/90  Pulse: 74  Weight: 156 lb 9.6 oz (71 kg)  Height: 5' 0.5 (1.537 m)    Physical Exam Vitals reviewed. Exam conducted with a chaperone present.  Constitutional:      General: She is not in acute distress. Pulmonary:     Effort: Pulmonary effort is normal.  Abdominal:     General: There is no distension.     Palpations: Abdomen is soft.     Tenderness: There is no abdominal tenderness. There is no rebound.  Musculoskeletal:        General: No swelling. Normal range of motion.  Skin:    General: Skin is warm and dry.     Findings: No rash.  Neurological:     Mental Status: She is alert and oriented to person, place, and time.  Psychiatric:        Mood and Affect: Mood normal.        Behavior: Behavior normal.      GU / Detailed Urogynecologic Evaluation:  Pelvic Exam: Normal external female genitalia; Bartholin's and Skene's glands normal in appearance; urethral meatus normal in appearance, no urethral masses or discharge.   CST: negative s/p hysterectomy: Speculum exam reveals normal vaginal mucosa without  atrophy and normal vaginal cuff.  Adnexa no mass, fullness, tenderness.    Pelvic floor strength II/V, puborectalis III/V external anal sphincter III/V  Pelvic floor musculature: Right levator non-tender, Right obturator non-tender, Left levator non-tender, Left obturator non-tender  POP-Q:   POP-Q  0                                             Aa   0                                           Ba  -5  C   4                                            Gh  5                                            Pb  8                                            tvl   -1.5                                            Ap  -1.5                                            Bp                                                 D      Rectal Exam:  Normal sphincter tone, small distal rectocele, enterocoele not present, no rectal masses, no sign of dyssynergia when asking the patient to bear down.  Post-Void Residual (PVR) by Bladder Scan: In order to evaluate bladder emptying, we discussed obtaining a postvoid residual and patient agreed to this procedure.  Procedure: The ultrasound unit was placed on the patient's abdomen in the suprapubic region after the patient had voided.    Post Void Residual - 07/10/24 1057       Post Void Residual   Post Void Residual 1 mL           Laboratory Results: Lab Results  Component Value Date   COLORU yellow 07/10/2024   CLARITYU clear 07/10/2024   GLUCOSEUR negative 07/10/2024   BILIRUBINUR negative 07/10/2024   SPECGRAV 1.020 07/10/2024   RBCUR negative 07/10/2024   PHUR 5.5 07/10/2024   PROTEINUR 30 mg/dL 98/87/7983   UROBILINOGEN 0.2 07/10/2024   LEUKOCYTESUR Small (1+) (A) 07/10/2024    Lab Results  Component Value Date   CREATININE 1.00 06/03/2022   CREATININE 0.97 06/02/2022   CREATININE 0.91 09/05/2018    No results found for: HGBA1C  Lab Results  Component Value Date   HGB 13.4 06/03/2022     ASSESSMENT AND PLAN Ms. Feltz is a 78 y.o. with:  1. Vaginal vault prolapse after hysterectomy   2. Urinary frequency   3. Urine leukocytes     Vaginal vault prolapse after hysterectomy Assessment & Plan: - Stage II anterior, Stage II posterior, Stage I apical prolapse - For treatment of  pelvic organ prolapse, we discussed options for management including expectant management, conservative management, and surgical management, such as Kegels, a pessary, pelvic floor physical therapy, and specific surgical procedures. - She is not interested in trying a pessary again - We discussed two  options for prolapse repair:  1) vaginal repair without mesh - Pros - safer, no mesh complications - Cons - not as strong as mesh repair, higher risk of recurrence  2) laparoscopic repair with mesh - Pros - stronger, better long-term success - Cons - risks of mesh implant (erosion into vagina or bladder, adhering to the rectum, pain) - these risks are lower than with a vaginal mesh but still exist - Handouts provided on surgical options but she prefers to expectantly manage at this time. Advised her to avoid heavy lifting or straining as this can exacerbate symptoms. Will have her return in a year to reassess symptoms and desire for surgery. If she has symptoms of incomplete bladder emptying then she should return sooner for evaluation.    Urinary frequency -     POCT URINALYSIS DIP (CLINITEK)  Urine leukocytes -     Urine Culture; Future  Return 1 year or sooner if needed   Rosaline LOISE Caper, MD

## 2024-07-11 LAB — URINE CULTURE: Culture: NO GROWTH
